# Patient Record
Sex: Male | Born: 1950
Health system: Southern US, Community
[De-identification: ages and names within clinical notes are randomized; demographics above are authoritative.]

## PROBLEM LIST (undated history)

## (undated) DIAGNOSIS — E079 Disorder of thyroid, unspecified: Secondary | ICD-10-CM

## (undated) DIAGNOSIS — C61 Malignant neoplasm of prostate: Secondary | ICD-10-CM

## (undated) DIAGNOSIS — I10 Essential (primary) hypertension: Secondary | ICD-10-CM

## (undated) DIAGNOSIS — K5792 Diverticulitis of intestine, part unspecified, without perforation or abscess without bleeding: Secondary | ICD-10-CM

## (undated) DIAGNOSIS — K219 Gastro-esophageal reflux disease without esophagitis: Secondary | ICD-10-CM

## (undated) DIAGNOSIS — E785 Hyperlipidemia, unspecified: Secondary | ICD-10-CM

## (undated) HISTORY — PX: HERNIA REPAIR: SHX51

## (undated) HISTORY — DX: Disorder of thyroid, unspecified: E07.9

## (undated) HISTORY — DX: Diverticulitis of intestine, part unspecified, without perforation or abscess without bleeding: K57.92

## (undated) HISTORY — DX: Gastro-esophageal reflux disease without esophagitis: K21.9

## (undated) HISTORY — PX: OTHER SURGICAL HISTORY: SHX169

## (undated) HISTORY — PX: UPPER GI ENDOSCOPY: SHX6162

## (undated) HISTORY — DX: Essential (primary) hypertension: I10

## (undated) HISTORY — PX: PROSTATE BIOPSY: SHX241

## (undated) HISTORY — DX: Hyperlipidemia, unspecified: E78.5

---

## 2003-04-11 DIAGNOSIS — K5792 Diverticulitis of intestine, part unspecified, without perforation or abscess without bleeding: Secondary | ICD-10-CM

## 2003-04-11 HISTORY — DX: Diverticulitis of intestine, part unspecified, without perforation or abscess without bleeding: K57.92

## 2009-12-29 LAB — HM COLONOSCOPY: HM Colonoscopy: NORMAL

## 2011-01-28 ENCOUNTER — Emergency Department (HOSPITAL_COMMUNITY)
Admission: EM | Admit: 2011-01-28 | Discharge: 2011-01-28 | Disposition: A | Discharge status: Home / Self Care | Payer: BC Managed Care – PPO | Attending: Emergency Medicine | Admitting: Emergency Medicine

## 2011-01-28 ENCOUNTER — Emergency Department (HOSPITAL_COMMUNITY): Payer: BC Managed Care – PPO

## 2011-01-28 DIAGNOSIS — Z79899 Other long term (current) drug therapy: Secondary | ICD-10-CM | POA: Insufficient documentation

## 2011-01-28 DIAGNOSIS — R0789 Other chest pain: Secondary | ICD-10-CM | POA: Insufficient documentation

## 2011-01-28 DIAGNOSIS — K219 Gastro-esophageal reflux disease without esophagitis: Secondary | ICD-10-CM | POA: Insufficient documentation

## 2011-01-28 DIAGNOSIS — I10 Essential (primary) hypertension: Secondary | ICD-10-CM | POA: Insufficient documentation

## 2011-01-28 LAB — POCT I-STAT, CHEM 8
Chloride: 106 mEq/L (ref 96–112)
Creatinine, Ser: 1 mg/dL (ref 0.50–1.35)
Glucose, Bld: 108 mg/dL — ABNORMAL HIGH (ref 70–99)
HCT: 41 % (ref 39.0–52.0)
Potassium: 3.6 mEq/L (ref 3.5–5.1)

## 2011-01-28 LAB — COMPREHENSIVE METABOLIC PANEL
ALT: 18 U/L (ref 0–53)
Alkaline Phosphatase: 47 U/L (ref 39–117)
CO2: 25 mEq/L (ref 19–32)
Calcium: 9.9 mg/dL (ref 8.4–10.5)
GFR calc Af Amer: >90 mL/min (ref >90)
GFR calc non Af Amer: >90 mL/min (ref >90)
Glucose, Bld: 108 mg/dL — ABNORMAL HIGH (ref 70–99)
Potassium: 3.5 mEq/L (ref 3.5–5.1)
Sodium: 136 mEq/L (ref 135–145)
Total Bilirubin: 0.5 mg/dL (ref 0.3–1.2)

## 2011-01-28 LAB — DIFFERENTIAL
Eosinophils Relative: 3 % (ref 0–5)
Lymphocytes Relative: 28 % (ref 12–46)
Lymphs Abs: 2 10*3/uL (ref 0.7–4.0)

## 2011-01-28 LAB — CBC
HCT: 39.6 % (ref 39.0–52.0)
MCV: 89.2 fL (ref 78.0–100.0)
RDW: 13 % (ref 11.5–15.5)
WBC: 7.3 10*3/uL (ref 4.0–10.5)

## 2011-01-30 ENCOUNTER — Encounter: Payer: Self-pay | Admitting: Internal Medicine

## 2011-01-30 ENCOUNTER — Other Ambulatory Visit (INDEPENDENT_AMBULATORY_CARE_PROVIDER_SITE_OTHER): Payer: BC Managed Care – PPO

## 2011-01-30 ENCOUNTER — Ambulatory Visit (INDEPENDENT_AMBULATORY_CARE_PROVIDER_SITE_OTHER): Payer: BC Managed Care – PPO | Admitting: Internal Medicine

## 2011-01-30 VITALS — BP 126/62 | HR 82 | Temp 97.8°F | Resp 16 | Wt 232.0 lb

## 2011-01-30 DIAGNOSIS — R109 Unspecified abdominal pain: Secondary | ICD-10-CM

## 2011-01-30 DIAGNOSIS — R0789 Other chest pain: Secondary | ICD-10-CM

## 2011-01-30 DIAGNOSIS — Z23 Encounter for immunization: Secondary | ICD-10-CM

## 2011-01-30 LAB — URINALYSIS, ROUTINE W REFLEX MICROSCOPIC
Bilirubin Urine: NEGATIVE
Hgb urine dipstick: NEGATIVE
Ketones, ur: NEGATIVE
Total Protein, Urine: NEGATIVE
Urine Glucose: NEGATIVE

## 2011-01-30 NOTE — Assessment & Plan Note (Addendum)
CXR shows no pathology, I will check a UA today to look for blood, etc. If there is blood or if the pain persists I may consider a CT scan of the area.

## 2011-01-30 NOTE — Assessment & Plan Note (Signed)
A repeat EKG today shows no acute pathology, I have asked him to do an ETT to look for ischemia, also he had a complete physical in May 2012 with Dr. Georgiana Shore so I have asked for those records to help give him some risk stratification for CAD

## 2011-01-30 NOTE — Progress Notes (Signed)
Subjective:    Patient ID: Juan Trevino, male    DOB: 1951/03/04, 60 y.o.   MRN: [id number]  HPI New to me he was seen in the ER over the last weekend with CP and testing done in the ER was negative for ischemia and he tells me that the CP has resolved. He also complains of sharp pain over his right lower rib cage and flank for nearly one year - it started when he "pulled a muscle" and has not resolved. His CXR done recently was negative for any pathology and his CBC/BMP were normal.   Review of Systems  Constitutional: Negative for fever, chills, diaphoresis, activity change, appetite change, fatigue and unexpected weight change.  HENT: Negative.   Eyes: Negative.   Respiratory: Negative for apnea, cough, choking, chest tightness, shortness of breath, wheezing and stridor.   Cardiovascular: Positive for chest pain. Negative for palpitations and leg swelling.  Gastrointestinal: Negative for nausea, vomiting, abdominal pain, diarrhea, constipation, blood in stool, abdominal distention, anal bleeding and rectal pain.  Genitourinary: Positive for flank pain (right). Negative for dysuria, urgency, frequency, hematuria, decreased urine volume, enuresis, difficulty urinating, genital sores and testicular pain.  Musculoskeletal: Negative for myalgias, back pain, joint swelling, arthralgias and gait problem.  Skin: Negative for color change, pallor, rash and wound.  Neurological: Negative for dizziness, tremors, seizures, syncope, facial asymmetry, speech difficulty, weakness, light-headedness, numbness and headaches.  Hematological: Negative for adenopathy. Does not bruise/bleed easily.  Psychiatric/Behavioral: Negative.        Objective:   Physical Exam  Vitals reviewed. Constitutional: He is oriented to person, place, and time. He appears well-developed and well-nourished. No distress.  HENT:  Head: Normocephalic and atraumatic.  Mouth/Throat: Oropharynx is clear and moist. No oropharyngeal  exudate.  Eyes: Conjunctivae are normal. Right eye exhibits no discharge. Left eye exhibits no discharge. No scleral icterus.  Neck: Normal range of motion. Neck supple. No JVD present. No tracheal deviation present. No thyromegaly present.  Cardiovascular: Normal rate, regular rhythm, normal heart sounds and intact distal pulses.  PMI is not displaced.  Exam reveals no gallop and no friction rub.   No murmur heard.  No systolic murmur is present   No diastolic murmur is present  Pulmonary/Chest: Effort normal and breath sounds normal. No stridor. No respiratory distress. He has no wheezes. He has no rales. Chest wall is not dull to percussion. He exhibits tenderness (he has ttp over the lowest aspect of his right rib cage). He exhibits no mass, no bony tenderness, no laceration, no crepitus, no edema, no deformity, no swelling and no retraction.  Abdominal: Soft. Normal appearance and bowel sounds are normal. He exhibits no shifting dullness, no distension, no pulsatile liver, no fluid wave, no abdominal bruit, no ascites, no pulsatile midline mass and no mass. There is no hepatosplenomegaly. There is no tenderness. There is no rigidity, no rebound, no guarding, no CVA tenderness, no tenderness at McBurney's point and negative Murphy's sign. No hernia. Hernia confirmed negative in the right inguinal area and confirmed negative in the left inguinal area.  Musculoskeletal: Normal range of motion. He exhibits no edema and no tenderness.  Lymphadenopathy:    He has no cervical adenopathy.  Neurological: He is oriented to person, place, and time. He displays normal reflexes. No cranial nerve deficit. He exhibits normal muscle tone. Coordination normal.  Skin: Skin is warm and dry. No rash noted. He is not diaphoretic. No erythema. No pallor.  Psychiatric: He has a normal mood  and affect. His behavior is normal. Judgment and thought content normal.      Lab Results  Component Value Date   WBC 7.3  01/28/2011   HGB 13.9 01/28/2011   HCT 41.0 01/28/2011   PLT 214 01/28/2011   GLUCOSE 108* 01/28/2011   ALT 18 01/28/2011   AST 17 01/28/2011   NA 139 01/28/2011   K 3.6 01/28/2011   CL 106 01/28/2011   CREATININE 1.00 01/28/2011   BUN 16 01/28/2011   CO2 25 01/28/2011      Assessment & Plan:

## 2011-01-30 NOTE — Patient Instructions (Signed)
Chest Pain (Nonspecific) It is often hard to give a specific diagnosis for the cause of chest pain. There is always a chance that your pain could be related to something serious, such as a heart attack or a blood clot in the lungs. You need to follow up with your caregiver for further evaluation. CAUSES   Heartburn.   Pneumonia or bronchitis.   Anxiety and stress.   Inflammation around your heart (pericarditis) or lung (pleuritis or pleurisy).   A blood clot in the lung.   A collapsed lung (pneumothorax). It can develop suddenly on its own (spontaneous pneumothorax) or from injury (trauma) to the chest.  The chest wall is composed of bones, muscles, and cartilage. Any of these can be the source of the pain.  The bones can be bruised by injury.   The muscles or cartilage can be strained by coughing or overwork.   The cartilage can be affected by inflammation and become sore (costochondritis).  DIAGNOSIS  Lab tests or other studies, such as X-rays, an EKG, stress testing, or cardiac imaging, may be needed to find the cause of your pain.  TREATMENT   Treatment depends on what may be causing your chest pain. Treatment may include:   Acid blockers for heartburn.   Anti-inflammatory medicine.   Pain medicine for inflammatory conditions.   Antibiotics if an infection is present.   You may be advised to change lifestyle habits. This includes stopping smoking and avoiding caffeine and chocolate.   You may be advised to keep your head raised (elevated) when sleeping. This reduces the chance of acid going backward from your stomach into your esophagus.   Most of the time, nonspecific chest pain will improve within 2 to 3 days with rest and mild pain medicine.  HOME CARE INSTRUCTIONS   If antibiotics were prescribed, take the full amount even if you start to feel better.   For the next few days, avoid physical activities that bring on chest pain. Continue physical activities as  directed.   Do not smoke cigarettes or drink alcohol until your symptoms are gone.   Only take over-the-counter or prescription medicine for pain, discomfort, or fever as directed by your caregiver.   Follow your caregiver's suggestions for further testing if your chest pain does not go away.   Keep any follow-up appointments you made. If you do not go to an appointment, you could develop lasting (chronic) problems with pain. If there is any problem keeping an appointment, you must call to reschedule.  SEEK MEDICAL CARE IF:   You think you are having problems from the medicine you are taking. Read your medicine instructions carefully.   Your chest pain does not go away, even after treatment.   You develop a rash with blisters on your chest.  SEEK IMMEDIATE MEDICAL CARE IF:   You have increased chest pain or pain that spreads to your arm, neck, jaw, back, or belly (abdomen).   You develop shortness of breath, an increasing cough, or you are coughing up blood.   You have severe back or abdominal pain, feel sick to your stomach (nauseous) or throw up (vomit).   You develop severe weakness, fainting, or chills.   You have an oral temperature above 102 F (38.9 C), not controlled by medicine.  THIS IS AN EMERGENCY. Do not wait to see if the pain will go away. Get medical help at once. Call your local emergency services (911 in [place] Do not drive yourself to   the hospital. MAKE SURE YOU:   Understand these instructions.   Will watch your condition.   Will get help right away if you are not doing well or get worse.  Document Released: 01/04/2005 Document Revised: 12/07/2010 Document Reviewed: 10/31/2007 Exit[employer] Patient Information 2012 [employer]. 

## 2011-02-08 ENCOUNTER — Telehealth: Payer: Self-pay

## 2011-02-08 NOTE — Telephone Encounter (Signed)
Patient called [facility] requesting results of recent labs. Please advise Thanks

## 2011-02-08 NOTE — Telephone Encounter (Signed)
Urine was normal

## 2011-02-09 NOTE — Telephone Encounter (Signed)
Pt also had labs on 01/28/11 that were scanned to Epic. Please advise.

## 2011-02-09 NOTE — Telephone Encounter (Signed)
Pt.notified

## 2011-02-09 NOTE — Telephone Encounter (Signed)
I have reviewed several sets of labs, the only thing I see abnormal is mildly elevated blood sugar

## 2011-02-09 NOTE — Telephone Encounter (Signed)
Left message on machine to return my call. 

## 2011-02-10 ENCOUNTER — Telehealth: Payer: Self-pay

## 2011-02-10 DIAGNOSIS — R109 Unspecified abdominal pain: Secondary | ICD-10-CM

## 2011-02-10 DIAGNOSIS — R10A1 Flank pain, right side: Secondary | ICD-10-CM

## 2011-02-10 NOTE — Telephone Encounter (Signed)
CT scan to see what is causing the flank pain

## 2011-02-10 NOTE — Telephone Encounter (Signed)
Patient called to check status of stress test appt. Appt set and pt notified of appt and labs. Per pt he continues to have have and would like to know what additional advisement MD has.

## 2011-02-15 ENCOUNTER — Encounter: Payer: Self-pay | Admitting: Internal Medicine

## 2011-02-15 LAB — CBC WITH DIFFERENTIAL/PLATELET
Granulocyte count absolute: 5.1
Granulocytes:: 65
Hemoglobin: 13.6 g/dL (ref 13.5–17.5)
LYMPH%: 26 %
RBC: 4.43
RDW: 12.9

## 2011-02-15 LAB — COMPLETE METABOLIC PANEL WITH GFR
Albumin: 4.3
BUN, Bld: 14
Calcium: 9.3 mg/dL
Chloride: 103 mmol/L
Glucose: 94
Potassium: 4.2 mmol/L
Total Protein ELP: 6.7

## 2011-02-15 LAB — CHOLESTEROL, TOTAL
TSH: 1.787
Triglycerides: 135

## 2011-02-16 ENCOUNTER — Ambulatory Visit (INDEPENDENT_AMBULATORY_CARE_PROVIDER_SITE_OTHER)
Admission: RE | Admit: 2011-02-16 | Discharge: 2011-02-16 | Disposition: A | Discharge status: Home / Self Care | Payer: BC Managed Care – PPO | Source: Ambulatory Visit | Attending: Internal Medicine | Admitting: Internal Medicine

## 2011-02-16 ENCOUNTER — Encounter: Payer: Self-pay | Admitting: Internal Medicine

## 2011-02-16 DIAGNOSIS — R109 Unspecified abdominal pain: Secondary | ICD-10-CM

## 2011-02-16 MED ORDER — IOHEXOL 300 MG/ML  SOLN
100.0000 mL | Freq: Once | INTRAMUSCULAR | Status: AC | PRN
  Administered 2011-02-16: 100 mL via INTRAVENOUS

## 2011-02-17 ENCOUNTER — Encounter: Payer: BC Managed Care – PPO | Admitting: *Deleted

## 2011-02-17 ENCOUNTER — Encounter: Payer: Self-pay | Admitting: *Deleted

## 2011-02-20 ENCOUNTER — Ambulatory Visit (INDEPENDENT_AMBULATORY_CARE_PROVIDER_SITE_OTHER): Payer: BC Managed Care – PPO | Admitting: Physician Assistant

## 2011-02-20 ENCOUNTER — Encounter: Payer: BC Managed Care – PPO | Admitting: Physician Assistant

## 2011-02-20 DIAGNOSIS — R0789 Other chest pain: Secondary | ICD-10-CM

## 2011-02-20 NOTE — Progress Notes (Deleted)
Adult Stress Test Report  02/20/2011   Requesting Physician: Sanda Linger, MD, MD  Study: {noninvasive [phone]  Pre-test ECG: {normal/abnormal:14647}  Level of Stress:  ***% age-predicted max HR  *** METS achieved  Functional Capacity: {funct capacity:14698}  Abnormal Symptoms: {symptoms:14699}  Heart Rate Response: {hr response:14700}  BP Response:  {bp response:14701}  Baseline LVEF: Echo *** %,  Nuclear *** %  Stress ECG: {findings; ecg:14702}  Stress Imaging Report:  {findings; stress imaging:14703}   Impression:   {findings; stress test:14704}  Interpreted by:  Marlowe Kays 02/20/2011

## 2011-02-20 NOTE — Progress Notes (Signed)
Exercise Treadmill Test  Pre-Exercise Testing Evaluation Rhythm: normal sinus  Rate: 68   PR:  .16 QRS:  .10  QT:  .39 QTc: .41     Test  Exercise Tolerance Test Ordering MD: Sanda Linger M.D  Interpreting MD:  Tereso Newcomer PA-C  Unique Test No: 1  Treadmill:  1  Indication for ETT: chest pain - rule out ischemia  Contraindication to ETT: No   Stress Modality: exercise - treadmill  Cardiac Imaging Performed: non   Protocol: standard Bruce - maximal  Max BP:  212/75  Max MPHR (bpm):  159 85% MPR (bpm):  135  MPHR obtained (bpm):  153 % MPHR obtained:  95%  Reached 85% MPHR (min:sec):  4:58 Total Exercise Time (min-sec):  7:32  Workload in METS:  9.5 Borg Scale: 13  Reason ETT Terminated:  desired heart rate attained    ST Segment Analysis At Rest: normal ST segments - no evidence of significant ST depression With Exercise: non-specific ST changes  Other Information Arrhythmia:  No Angina during ETT:  absent (0) Quality of ETT:  diagnostic  ETT Interpretation:  normal - no evidence of ischemia by ST analysis  Comments: Good exercise tolerance. No chest pain. Normal BP response to exercise. No ST-T changes to suggest ischemia.   Recommendations: Follow up with Dr. Yetta Barre as directed.

## 2011-11-06 ENCOUNTER — Ambulatory Visit (INDEPENDENT_AMBULATORY_CARE_PROVIDER_SITE_OTHER)
Admission: RE | Admit: 2011-11-06 | Discharge: 2011-11-06 | Disposition: A | Discharge status: Home / Self Care | Payer: BC Managed Care – PPO | Source: Ambulatory Visit | Attending: Internal Medicine | Admitting: Internal Medicine

## 2011-11-06 ENCOUNTER — Other Ambulatory Visit (INDEPENDENT_AMBULATORY_CARE_PROVIDER_SITE_OTHER): Payer: BC Managed Care – PPO

## 2011-11-06 ENCOUNTER — Encounter: Payer: Self-pay | Admitting: Internal Medicine

## 2011-11-06 ENCOUNTER — Ambulatory Visit (INDEPENDENT_AMBULATORY_CARE_PROVIDER_SITE_OTHER): Payer: BC Managed Care – PPO | Admitting: Internal Medicine

## 2011-11-06 ENCOUNTER — Telehealth: Payer: Self-pay | Admitting: Internal Medicine

## 2011-11-06 VITALS — BP 118/66 | HR 70 | Temp 98.2°F | Resp 16 | Ht 72.0 in | Wt 233.0 lb

## 2011-11-06 DIAGNOSIS — L989 Disorder of the skin and subcutaneous tissue, unspecified: Secondary | ICD-10-CM | POA: Insufficient documentation

## 2011-11-06 DIAGNOSIS — M25551 Pain in right hip: Secondary | ICD-10-CM | POA: Insufficient documentation

## 2011-11-06 DIAGNOSIS — K219 Gastro-esophageal reflux disease without esophagitis: Secondary | ICD-10-CM

## 2011-11-06 DIAGNOSIS — R7309 Other abnormal glucose: Secondary | ICD-10-CM

## 2011-11-06 DIAGNOSIS — M545 Low back pain, unspecified: Secondary | ICD-10-CM

## 2011-11-06 DIAGNOSIS — I10 Essential (primary) hypertension: Secondary | ICD-10-CM

## 2011-11-06 DIAGNOSIS — Z0000 Encounter for general adult medical examination without abnormal findings: Secondary | ICD-10-CM | POA: Insufficient documentation

## 2011-11-06 DIAGNOSIS — M25559 Pain in unspecified hip: Secondary | ICD-10-CM

## 2011-11-06 LAB — COMPREHENSIVE METABOLIC PANEL
ALT: 18 U/L (ref 0–53)
Albumin: 4 g/dL (ref 3.5–5.2)
CO2: 28 mEq/L (ref 19–32)
Calcium: 9.7 mg/dL (ref 8.4–10.5)
Chloride: 102 mEq/L (ref 96–112)
GFR: 77.68 mL/min (ref >60.00)
Glucose, Bld: 93 mg/dL (ref 70–99)
Potassium: 4 mEq/L (ref 3.5–5.1)
Sodium: 137 mEq/L (ref 135–145)
Total Protein: 7 g/dL (ref 6.0–8.3)

## 2011-11-06 LAB — CBC WITH DIFFERENTIAL/PLATELET
Basophils Absolute: 0 10*3/uL (ref 0.0–0.1)
Eosinophils Relative: 3.8 % (ref 0.0–5.0)
HCT: 41.5 % (ref 39.0–52.0)
Lymphocytes Relative: 29 % (ref 12.0–46.0)
Lymphs Abs: 2.2 10*3/uL (ref 0.7–4.0)
Monocytes Relative: 7.3 % (ref 3.0–12.0)
Neutrophils Relative %: 59.3 % (ref 43.0–77.0)
Platelets: 204 10*3/uL (ref 150.0–400.0)
RDW: 13.1 % (ref 11.5–14.6)
WBC: 7.5 10*3/uL (ref 4.5–10.5)

## 2011-11-06 LAB — URINALYSIS, ROUTINE W REFLEX MICROSCOPIC
Hgb urine dipstick: NEGATIVE
Nitrite: NEGATIVE
Specific Gravity, Urine: >=1.03 (ref 1.000–1.030)
Total Protein, Urine: NEGATIVE
Urine Glucose: NEGATIVE

## 2011-11-06 LAB — TSH: TSH: 1.41 u[IU]/mL (ref 0.35–5.50)

## 2011-11-06 LAB — LIPID PANEL: HDL: 46.1 mg/dL (ref >39.00)

## 2011-11-06 LAB — HEMOGLOBIN A1C: Hgb A1c MFr Bld: 5.6 % (ref 4.6–6.5)

## 2011-11-06 LAB — PSA: PSA: 2.04 ng/mL (ref 0.10–4.00)

## 2011-11-06 LAB — FECAL OCCULT BLOOD, GUAIAC: Fecal Occult Blood: NEGATIVE

## 2011-11-06 MED ORDER — PANTOPRAZOLE SODIUM 40 MG PO TBEC: 40.0000 mg | DELAYED_RELEASE_TABLET | Freq: Every day | ORAL | Status: DC

## 2011-11-06 MED ORDER — LISINOPRIL 20 MG PO TABS: 20.0000 mg | ORAL_TABLET | Freq: Every day | ORAL | Status: DC

## 2011-11-06 NOTE — Patient Instructions (Signed)
Health Maintenance, Males A healthy lifestyle and preventative care can promote health and wellness.  Maintain regular health, dental, and eye exams.   Eat a healthy diet. Foods like vegetables, fruits, whole grains, low-fat dairy products, and lean protein foods contain the nutrients you need without too many calories. Decrease your intake of foods high in solid fats, added sugars, and salt. Get information about a proper diet from your caregiver, if necessary.   Regular physical exercise is one of the most important things you can do for your health. Most adults should get at least 150 minutes of moderate-intensity exercise (any activity that increases your heart rate and causes you to sweat) each week. In addition, most adults need muscle-strengthening exercises on 2 or more days a week.    Maintain a healthy weight. The body mass index (BMI) is a screening tool to identify possible weight problems. It provides an estimate of body fat based on height and weight. Your caregiver can help determine your BMI, and can help you achieve or maintain a healthy weight. For adults 20 years and older:   A BMI below 18.5 is considered underweight.   A BMI of 18.5 to 24.9 is normal.   A BMI of 25 to 29.9 is considered overweight.   A BMI of 30 and above is considered obese.   Maintain normal blood lipids and cholesterol by exercising and minimizing your intake of saturated fat. Eat a balanced diet with plenty of fruits and vegetables. Blood tests for lipids and cholesterol should begin at age 20 and be repeated every 5 years. If your lipid or cholesterol levels are high, you are over 50, or you are a high risk for heart disease, you may need your cholesterol levels checked more frequently.Ongoing high lipid and cholesterol levels should be treated with medicines, if diet and exercise are not effective.   If you smoke, find out from your caregiver how to quit. If you do not use tobacco, do not start.    If you choose to drink alcohol, do not exceed 2 drinks per day. One drink is considered to be 12 ounces (355 mL) of beer, 5 ounces (148 mL) of wine, or 1.5 ounces (44 mL) of liquor.   Avoid use of street drugs. Do not share needles with anyone. Ask for help if you need support or instructions about stopping the use of drugs.   High blood pressure causes heart disease and increases the risk of stroke. Blood pressure should be checked at least every 1 to 2 years. Ongoing high blood pressure should be treated with medicines if weight loss and exercise are not effective.   If you are 45 to 62 years old, ask your caregiver if you should take aspirin to prevent heart disease.   Diabetes screening involves taking a blood sample to check your fasting blood sugar level. This should be done once every 3 years, after age 45, if you are within normal weight and without risk factors for diabetes. Testing should be considered at a younger age or be carried out more frequently if you are overweight and have at least 1 risk factor for diabetes.   Colorectal cancer can be detected and often prevented. Most routine colorectal cancer screening begins at the age of 50 and continues through age 75. However, your caregiver may recommend screening at an earlier age if you have risk factors for colon cancer. On a yearly basis, your caregiver may provide home test kits to check for hidden   blood in the stool. Use of a small camera at the end of a tube, to directly examine the colon (sigmoidoscopy or colonoscopy), can detect the earliest forms of colorectal cancer. Talk to your caregiver about this at age 50, when routine screening begins. Direct examination of the colon should be repeated every 5 to 10 years through age 75, unless early forms of pre-cancerous polyps or small growths are found.   Hepatitis C blood testing is recommended for all people born from 1945 through 1965 and any individual with known risks for  hepatitis C.   Healthy men should no longer receive prostate-specific antigen (PSA) blood tests as part of routine cancer screening. Consult with your caregiver about prostate cancer screening.   Testicular cancer screening is not recommended for adolescents or adult males who have no symptoms. Screening includes self-exam, caregiver exam, and other screening tests. Consult with your caregiver about any symptoms you have or any concerns you have about testicular cancer.   Practice safe sex. Use condoms and avoid high-risk sexual practices to reduce the spread of sexually transmitted infections (STIs).   Use sunscreen with a sun protection factor (SPF) of 30 or greater. Apply sunscreen liberally and repeatedly throughout the day. You should seek shade when your shadow is shorter than you. Protect yourself by wearing long sleeves, pants, a wide-brimmed hat, and sunglasses year round, whenever you are outdoors.   Notify your caregiver of new moles or changes in moles, especially if there is a change in shape or color. Also notify your caregiver if a mole is larger than the size of a pencil eraser.   A one-time screening for abdominal aortic aneurysm (AAA) and surgical repair of large AAAs by sound wave imaging (ultrasonography) is recommended for ages 65 to 75 years who are current or former smokers.   Stay current with your immunizations.  Document Released: 09/23/2007 Document Revised: 03/16/2011 Document Reviewed: 08/22/2010 [employer] Patient Information 2012 [employer]. 

## 2011-11-06 NOTE — Assessment & Plan Note (Signed)
I will check a plain film today 

## 2011-11-06 NOTE — Assessment & Plan Note (Signed)
He does not want any meds for pain, I will check a plain film today

## 2011-11-06 NOTE — Telephone Encounter (Signed)
Pt req Lisinopril 20 mg to be send to [facility] on [address]. Pt stated he was just in for an appt and forgot to ask refill for this med. Please call pt once its done.

## 2011-11-06 NOTE — Assessment & Plan Note (Signed)
His BP is well controlled, I will check his lytes and renal function 

## 2011-11-06 NOTE — Assessment & Plan Note (Signed)
I will check an a1c to see if he has DM II

## 2011-11-06 NOTE — Progress Notes (Signed)
Subjective:    Patient ID: Juan Trevino, male    DOB: 04/15/1950, 61 y.o.   MRN: [id number]  Arthritis Presents for initial visit. The disease course has been worsening. The condition has lasted for 4 months. He complains of pain. He reports no stiffness, joint swelling or joint warmth. Affected locations include the right hip. His pain is at a severity of 2/10. Pertinent negatives include no diarrhea, dry eyes, dry mouth, dysuria, fatigue, fever, pain at night, pain while resting, rash, Raynaud's syndrome, uveitis or weight loss. His past medical history is significant for chronic back pain and osteoarthritis. Past treatments include nothing.      Review of Systems  Constitutional: Negative for fever, chills, weight loss, diaphoresis, appetite change, fatigue and unexpected weight change.  HENT: Negative.   Eyes: Negative.   Respiratory: Negative for apnea, cough, choking, chest tightness, shortness of breath, wheezing and stridor.   Cardiovascular: Negative for chest pain, palpitations and leg swelling.  Gastrointestinal: Negative for nausea, vomiting, abdominal pain, diarrhea, constipation, blood in stool, abdominal distention, anal bleeding and rectal pain.  Genitourinary: Negative.  Negative for dysuria and difficulty urinating.  Musculoskeletal: Positive for back pain (unchanged for one year), arthralgias (right hip for several months) and arthritis. Negative for myalgias, joint swelling, gait problem and stiffness.  Skin: Negative for color change, pallor, rash and wound.  Neurological: Negative.   Hematological: Negative for adenopathy. Does not bruise/bleed easily.  Psychiatric/Behavioral: Negative.        Objective:   Physical Exam  Vitals reviewed. Constitutional: He is oriented to person, place, and time. He appears well-developed and well-nourished. No distress.  HENT:  Head: Normocephalic and atraumatic.    Mouth/Throat: Oropharynx is clear and moist. No oropharyngeal  exudate.  Eyes: Conjunctivae and EOM are normal. Pupils are equal, round, and reactive to light. Right eye exhibits no discharge. Left eye exhibits no discharge. No scleral icterus.  Neck: Normal range of motion. Neck supple. No JVD present. No tracheal deviation present. No thyromegaly present.  Cardiovascular: Normal rate, regular rhythm, normal heart sounds and intact distal pulses.  Exam reveals no gallop and no friction rub.   No murmur heard. Pulmonary/Chest: Effort normal and breath sounds normal. No stridor. No respiratory distress. He has no wheezes. He has no rales. He exhibits no tenderness.  Abdominal: Soft. Bowel sounds are normal. He exhibits no distension and no mass. There is no tenderness. There is no rebound and no guarding. Hernia confirmed negative in the right inguinal area and confirmed negative in the left inguinal area.  Genitourinary: Rectum normal, prostate normal, testes normal and penis normal. Rectal exam shows no external hemorrhoid, no internal hemorrhoid, no fissure, no mass, no tenderness and anal tone normal. Guaiac negative stool. Prostate is not enlarged and not tender. Right testis shows no mass, no swelling and no tenderness. Right testis is descended. Left testis shows no mass, no swelling and no tenderness. Left testis is descended. Circumcised. No penile tenderness. No discharge found.  Musculoskeletal: Normal range of motion. He exhibits no edema and no tenderness.       Right hip: Normal. He exhibits normal range of motion, normal strength, no tenderness, no bony tenderness, no swelling, no crepitus and no deformity.       Lumbar back: Normal. He exhibits normal range of motion, no tenderness, no bony tenderness, no swelling, no edema, no deformity, no laceration, no pain and no spasm.  Lymphadenopathy:    He has no cervical adenopathy.  Right: No inguinal adenopathy present.       Left: No inguinal adenopathy present.  Neurological: He is alert and  oriented to person, place, and time. He has normal strength and normal reflexes. He displays no atrophy, no tremor and normal reflexes. No cranial nerve deficit or sensory deficit. He exhibits normal muscle tone. He displays a negative Romberg sign. He displays no seizure activity. Coordination and gait normal.  Skin: Skin is warm and dry. No rash noted. He is not diaphoretic. No erythema. No pallor.  Psychiatric: He has a normal mood and affect. His behavior is normal. Judgment and thought content normal.      Lab Results  Component Value Date   WBC 7.3 01/28/2011   HGB 13.9 01/28/2011   HCT 41.0 01/28/2011   PLT 214 01/28/2011   GLUCOSE 108* 01/28/2011   TRIG 135 08/19/2010   HDL 43 08/19/2010   LDLCALC 131 08/19/2010   ALT 18 01/28/2011   AST 17 01/28/2011   NA 139 01/28/2011   K 3.6 01/28/2011   CL 106 01/28/2011   CREATININE 1.00 01/28/2011   BUN 16 01/28/2011   CO2 25 01/28/2011   TSH 1.787 08/19/2010   PSA 1.77 08/19/2010      Assessment & Plan:

## 2011-11-06 NOTE — Assessment & Plan Note (Signed)
This looks like BCC so I referred him to dermatology

## 2011-11-06 NOTE — Assessment & Plan Note (Signed)
Exam done, vaccines were updated, labs ordered, pt ed material was given 

## 2012-11-06 ENCOUNTER — Encounter: Payer: Self-pay | Admitting: Internal Medicine

## 2012-11-06 ENCOUNTER — Other Ambulatory Visit (INDEPENDENT_AMBULATORY_CARE_PROVIDER_SITE_OTHER): Payer: BC Managed Care – PPO

## 2012-11-06 ENCOUNTER — Ambulatory Visit (INDEPENDENT_AMBULATORY_CARE_PROVIDER_SITE_OTHER): Payer: BC Managed Care – PPO | Admitting: Internal Medicine

## 2012-11-06 VITALS — BP 138/68 | HR 88 | Temp 98.7°F | Resp 16 | Ht 72.0 in | Wt 236.0 lb

## 2012-11-06 DIAGNOSIS — I10 Essential (primary) hypertension: Secondary | ICD-10-CM

## 2012-11-06 DIAGNOSIS — Z23 Encounter for immunization: Secondary | ICD-10-CM

## 2012-11-06 DIAGNOSIS — E669 Obesity, unspecified: Secondary | ICD-10-CM

## 2012-11-06 DIAGNOSIS — E66811 Obesity, class 1: Secondary | ICD-10-CM

## 2012-11-06 DIAGNOSIS — Z0000 Encounter for general adult medical examination without abnormal findings: Secondary | ICD-10-CM

## 2012-11-06 DIAGNOSIS — Z2911 Encounter for prophylactic immunotherapy for respiratory syncytial virus (RSV): Secondary | ICD-10-CM

## 2012-11-06 LAB — CBC WITH DIFFERENTIAL/PLATELET
Eosinophils Relative: 2.5 % (ref 0.0–5.0)
Lymphocytes Relative: 22.4 % (ref 12.0–46.0)
Lymphs Abs: 1.9 10*3/uL (ref 0.7–4.0)
Monocytes Relative: 6.5 % (ref 3.0–12.0)
Neutro Abs: 5.7 10*3/uL (ref 1.4–7.7)

## 2012-11-06 LAB — URINALYSIS, ROUTINE W REFLEX MICROSCOPIC
Hgb urine dipstick: NEGATIVE
Ketones, ur: NEGATIVE
Leukocytes, UA: NEGATIVE
RBC / HPF: NONE SEEN (ref 0–?)
Specific Gravity, Urine: 1.025 (ref 1.000–1.030)
Urine Glucose: NEGATIVE
Urobilinogen, UA: 1 (ref 0.0–1.0)

## 2012-11-06 LAB — LIPID PANEL
HDL: 42.5 mg/dL (ref >39.00)
VLDL: 60.2 mg/dL — ABNORMAL HIGH (ref 0.0–40.0)

## 2012-11-06 LAB — COMPREHENSIVE METABOLIC PANEL
AST: 18 U/L (ref 0–37)
Albumin: 3.9 g/dL (ref 3.5–5.2)
Alkaline Phosphatase: 44 U/L (ref 39–117)
Calcium: 9.8 mg/dL (ref 8.4–10.5)
Chloride: 103 mEq/L (ref 96–112)
Potassium: 4.4 mEq/L (ref 3.5–5.1)
Sodium: 139 mEq/L (ref 135–145)
Total Protein: 6.9 g/dL (ref 6.0–8.3)

## 2012-11-06 LAB — TSH: TSH: 1.48 u[IU]/mL (ref 0.35–5.50)

## 2012-11-06 LAB — PSA: PSA: 2.09 ng/mL (ref 0.10–4.00)

## 2012-11-06 NOTE — Progress Notes (Signed)
Subjective:    Patient ID: Juan Trevino, male    DOB: 1950-05-02, 62 y.o.   MRN: [id number]  Hypertension This is a chronic problem. The current episode started more than 1 year ago. The problem is unchanged. The problem is controlled. Pertinent negatives include no anxiety, blurred vision, chest pain, headaches, malaise/fatigue, neck pain, orthopnea, palpitations, peripheral edema, PND, shortness of breath or sweats. Past treatments include ACE inhibitors and diuretics. The current treatment provides moderate improvement. Compliance problems include exercise and diet.       Review of Systems  Constitutional: Negative.  Negative for fever, chills, malaise/fatigue, diaphoresis, appetite change, fatigue and unexpected weight change.  HENT: Negative.  Negative for neck pain.   Eyes: Negative.  Negative for blurred vision.  Respiratory: Negative.  Negative for cough, choking, chest tightness, shortness of breath, wheezing and stridor.   Cardiovascular: Negative.  Negative for chest pain, palpitations, orthopnea, leg swelling and PND.  Gastrointestinal: Negative.  Negative for nausea, vomiting, abdominal pain, diarrhea and constipation.  Endocrine: Negative.   Genitourinary: Negative.  Negative for penile swelling, scrotal swelling, difficulty urinating and testicular pain.  Musculoskeletal: Negative.  Negative for myalgias, back pain and arthralgias.  Skin: Negative.   Allergic/Immunologic: Negative.   Neurological: Negative.  Negative for dizziness and headaches.  Hematological: Negative.  Negative for adenopathy. Does not bruise/bleed easily.  Psychiatric/Behavioral: Negative.        Objective:   Physical Exam  Vitals reviewed. Constitutional: He is oriented to person, place, and time. He appears well-developed and well-nourished. No distress.  HENT:  Head: Normocephalic and atraumatic.  Mouth/Throat: Oropharynx is clear and moist. No oropharyngeal exudate.  Eyes: Conjunctivae are  normal. Right eye exhibits no discharge. Left eye exhibits no discharge. No scleral icterus.  Neck: Normal range of motion. Neck supple. No JVD present. No tracheal deviation present. No thyromegaly present.  Cardiovascular: Normal rate, regular rhythm, normal heart sounds and intact distal pulses.  Exam reveals no gallop and no friction rub.   No murmur heard. Pulmonary/Chest: Effort normal and breath sounds normal. No stridor. No respiratory distress. He has no wheezes. He has no rales. He exhibits no tenderness.  Abdominal: Soft. Bowel sounds are normal. He exhibits no distension and no mass. There is no tenderness. There is no rebound and no guarding. Hernia confirmed negative in the right inguinal area and confirmed negative in the left inguinal area.  Genitourinary: Rectum normal, prostate normal, testes normal and penis normal. Rectal exam shows no external hemorrhoid, no internal hemorrhoid, no fissure, no mass, no tenderness and anal tone normal. Guaiac negative stool. Prostate is not enlarged and not tender. Right testis shows no mass, no swelling and no tenderness. Right testis is descended. Left testis shows no mass, no swelling and no tenderness. Left testis is descended. Circumcised. No penile erythema or penile tenderness. No discharge found.  Musculoskeletal: Normal range of motion. He exhibits no edema and no tenderness.  Lymphadenopathy:    He has no cervical adenopathy.       Right: No inguinal adenopathy present.       Left: No inguinal adenopathy present.  Neurological: He is oriented to person, place, and time.  Skin: Skin is warm and dry. No rash noted. He is not diaphoretic. No erythema. No pallor.  Psychiatric: He has a normal mood and affect. His behavior is normal. Judgment and thought content normal.      Lab Results  Component Value Date   WBC 7.5 11/06/2011   HGB 14.0  11/06/2011   HCT 41.5 11/06/2011   PLT 204.0 11/06/2011   GLUCOSE 93 11/06/2011   CHOL 218*  11/06/2011   TRIG 122.0 11/06/2011   HDL 46.10 11/06/2011   LDLDIRECT 144.6 11/06/2011   LDLCALC 131 08/19/2010   ALT 18 11/06/2011   AST 17 11/06/2011   NA 137 11/06/2011   K 4.0 11/06/2011   CL 102 11/06/2011   CREATININE 1.0 11/06/2011   BUN 14 11/06/2011   CO2 28 11/06/2011   TSH 1.41 11/06/2011   PSA 2.04 11/06/2011   HGBA1C 5.6 11/06/2011      Assessment & Plan:

## 2012-11-06 NOTE — Assessment & Plan Note (Addendum)
Exam done Vaccines were reviewed and updated Labs ordered Pt ed material was given 

## 2012-11-06 NOTE — Assessment & Plan Note (Signed)
He will work on his lifestyle modifications to lose weight 

## 2012-11-06 NOTE — Assessment & Plan Note (Signed)
His BP is well controlled Today I will check his lytes and renal function 

## 2012-11-06 NOTE — Patient Instructions (Addendum)
Health Maintenance, Males A healthy lifestyle and preventative care can promote health and wellness.  Maintain regular health, dental, and eye exams.  Eat a healthy diet. Foods like vegetables, fruits, whole grains, low-fat dairy products, and lean protein foods contain the nutrients you need without too many calories. Decrease your intake of foods high in solid fats, added sugars, and salt. Get information about a proper diet from your caregiver, if necessary.  Regular physical exercise is one of the most important things you can do for your health. Most adults should get at least 150 minutes of moderate-intensity exercise (any activity that increases your heart rate and causes you to sweat) each week. In addition, most adults need muscle-strengthening exercises on 2 or more days a week.   Maintain a healthy weight. The body mass index (BMI) is a screening tool to identify possible weight problems. It provides an estimate of body fat based on height and weight. Your caregiver can help determine your BMI, and can help you achieve or maintain a healthy weight. For adults 20 years and older:  A BMI below 18.5 is considered underweight.  A BMI of 18.5 to 24.9 is normal.  A BMI of 25 to 29.9 is considered overweight.  A BMI of 30 and above is considered obese.  Maintain normal blood lipids and cholesterol by exercising and minimizing your intake of saturated fat. Eat a balanced diet with plenty of fruits and vegetables. Blood tests for lipids and cholesterol should begin at age 20 and be repeated every 5 years. If your lipid or cholesterol levels are high, you are over 50, or you are a high risk for heart disease, you may need your cholesterol levels checked more frequently.Ongoing high lipid and cholesterol levels should be treated with medicines, if diet and exercise are not effective.  If you smoke, find out from your caregiver how to quit. If you do not use tobacco, do not start.  If you  choose to drink alcohol, do not exceed 2 drinks per day. One drink is considered to be 12 ounces (355 mL) of beer, 5 ounces (148 mL) of wine, or 1.5 ounces (44 mL) of liquor.  Avoid use of street drugs. Do not share needles with anyone. Ask for help if you need support or instructions about stopping the use of drugs.  High blood pressure causes heart disease and increases the risk of stroke. Blood pressure should be checked at least every 1 to 2 years. Ongoing high blood pressure should be treated with medicines if weight loss and exercise are not effective.  If you are 45 to 63 years old, ask your caregiver if you should take aspirin to prevent heart disease.  Diabetes screening involves taking a blood sample to check your fasting blood sugar level. This should be done once every 3 years, after age 45, if you are within normal weight and without risk factors for diabetes. Testing should be considered at a younger age or be carried out more frequently if you are overweight and have at least 1 risk factor for diabetes.  Colorectal cancer can be detected and often prevented. Most routine colorectal cancer screening begins at the age of 50 and continues through age 75. However, your caregiver may recommend screening at an earlier age if you have risk factors for colon cancer. On a yearly basis, your caregiver may provide home test kits to check for hidden blood in the stool. Use of a small camera at the end of a tube,   to directly examine the colon (sigmoidoscopy or colonoscopy), can detect the earliest forms of colorectal cancer. Talk to your caregiver about this at age 50, when routine screening begins. Direct examination of the colon should be repeated every 5 to 10 years through age 75, unless early forms of pre-cancerous polyps or small growths are found.  Hepatitis C blood testing is recommended for all people born from 1945 through 1965 and any individual with known risks for hepatitis C.  Healthy  men should no longer receive prostate-specific antigen (PSA) blood tests as part of routine cancer screening. Consult with your caregiver about prostate cancer screening.  Testicular cancer screening is not recommended for adolescents or adult males who have no symptoms. Screening includes self-exam, caregiver exam, and other screening tests. Consult with your caregiver about any symptoms you have or any concerns you have about testicular cancer.  Practice safe sex. Use condoms and avoid high-risk sexual practices to reduce the spread of sexually transmitted infections (STIs).  Use sunscreen with a sun protection factor (SPF) of 30 or greater. Apply sunscreen liberally and repeatedly throughout the day. You should seek shade when your shadow is shorter than you. Protect yourself by wearing long sleeves, pants, a wide-brimmed hat, and sunglasses year round, whenever you are outdoors.  Notify your caregiver of new moles or changes in moles, especially if there is a change in shape or color. Also notify your caregiver if a mole is larger than the size of a pencil eraser.  A one-time screening for abdominal aortic aneurysm (AAA) and surgical repair of large AAAs by sound wave imaging (ultrasonography) is recommended for ages 65 to 75 years who are current or former smokers.  Stay current with your immunizations. Document Released: 09/23/2007 Document Revised: 06/19/2011 Document Reviewed: 08/22/2010 [employer] Patient Information 2014 [employer].  

## 2012-11-07 ENCOUNTER — Encounter: Payer: Self-pay | Admitting: Internal Medicine

## 2012-11-07 LAB — HEPATITIS C ANTIBODY: HCV Ab: NEGATIVE

## 2012-11-07 NOTE — Progress Notes (Signed)
  Subjective:    Patient ID: Juan Trevino, male    DOB: 06/26/1950, 62 y.o.   MRN: [id number]  HPI    Review of Systems     Objective:   Physical Exam        Assessment & Plan:

## 2013-01-16 ENCOUNTER — Other Ambulatory Visit: Payer: Self-pay | Admitting: Internal Medicine

## 2013-01-17 ENCOUNTER — Other Ambulatory Visit: Payer: Self-pay | Admitting: Internal Medicine

## 2013-08-29 IMAGING — CR DG HIP COMPLETE 2+V*R*
3 series · 3 of 3 positions shown · non-contrast
Comparison: None

CLINICAL DATA: Pain

RIGHT HIP - COMPLETE 2+ VIEW

[view not recorded (1 of 3)]
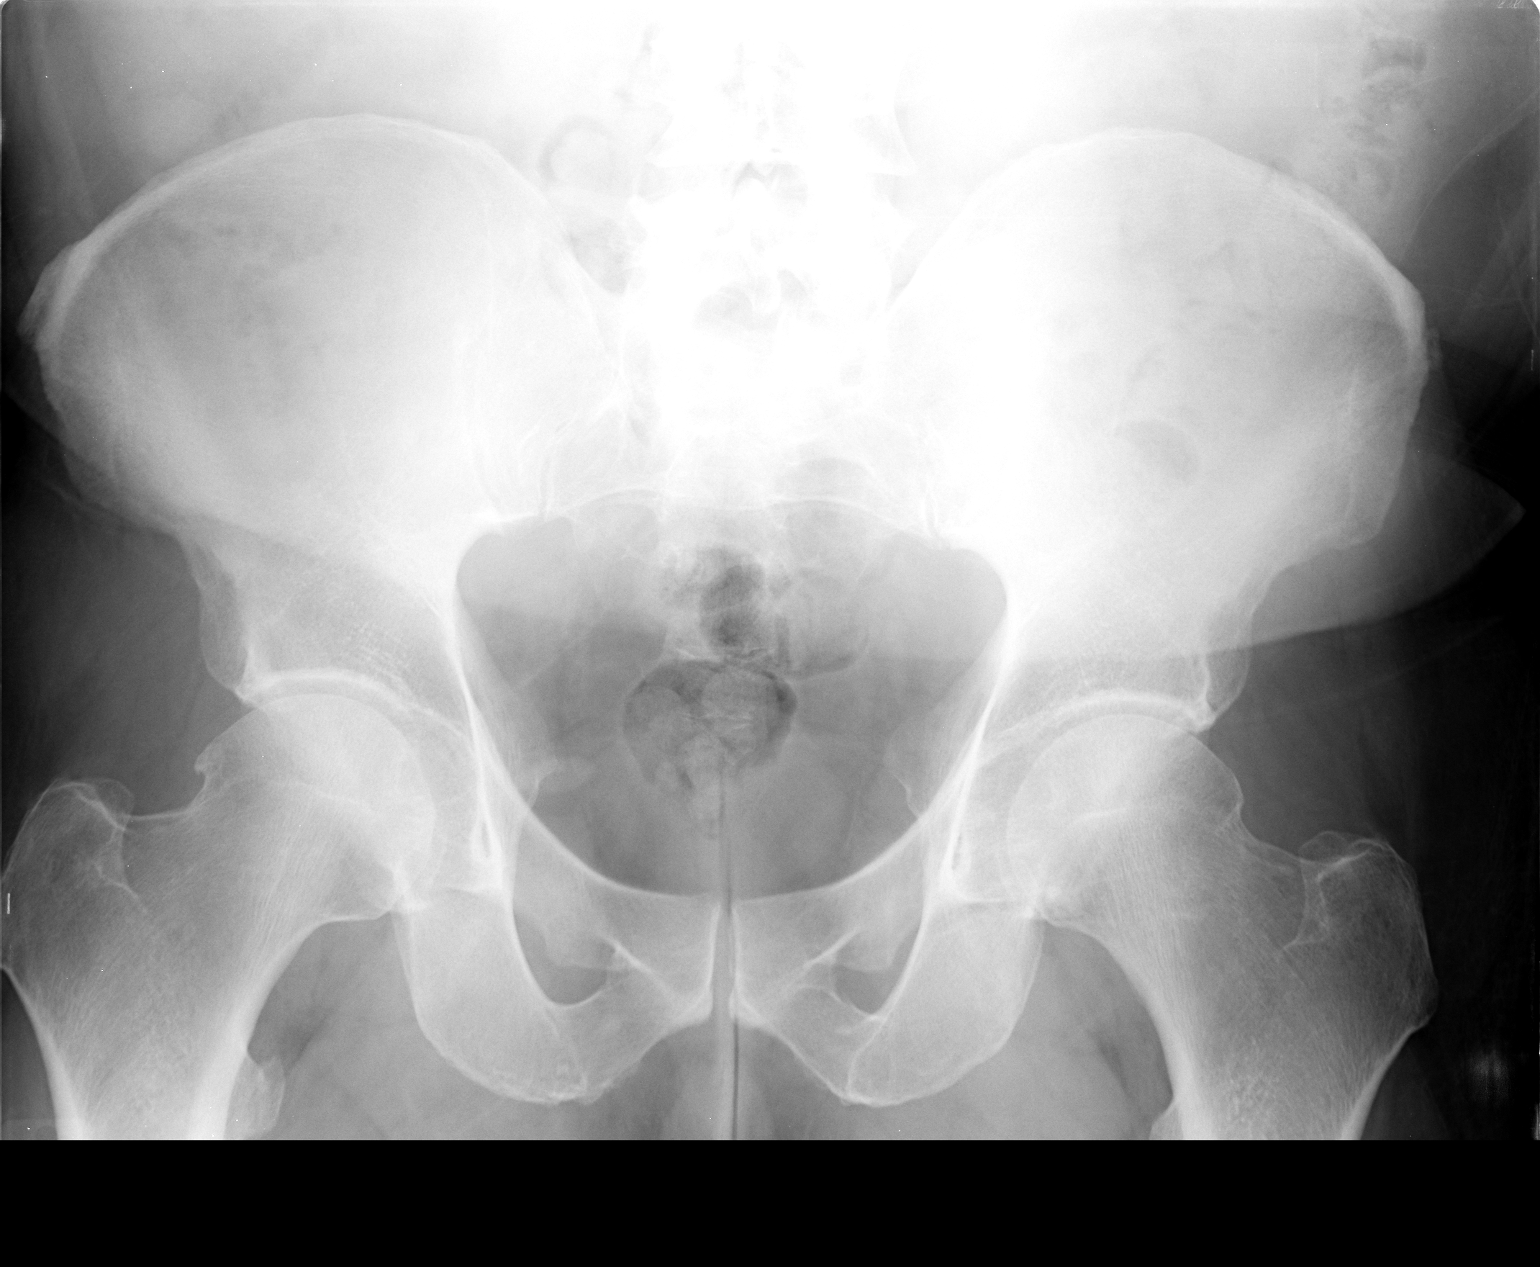

[view not recorded (2 of 3)]
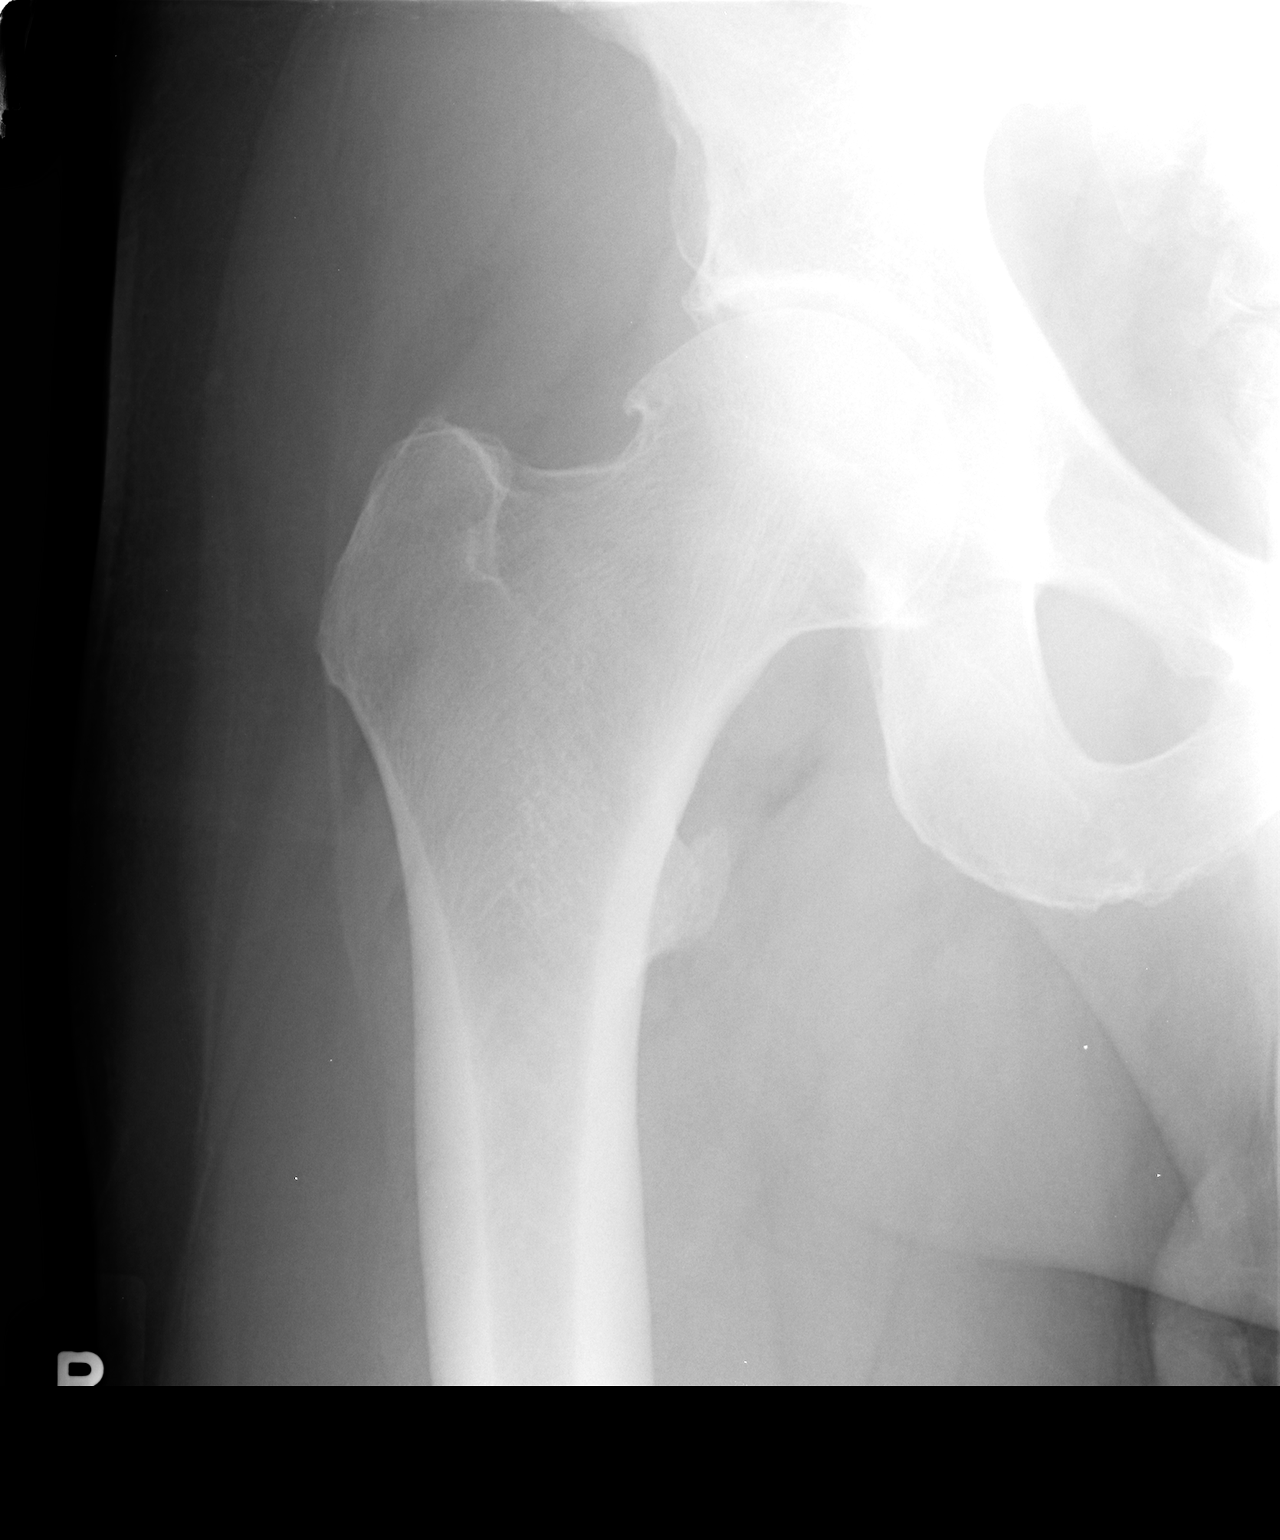

[view not recorded (3 of 3)]
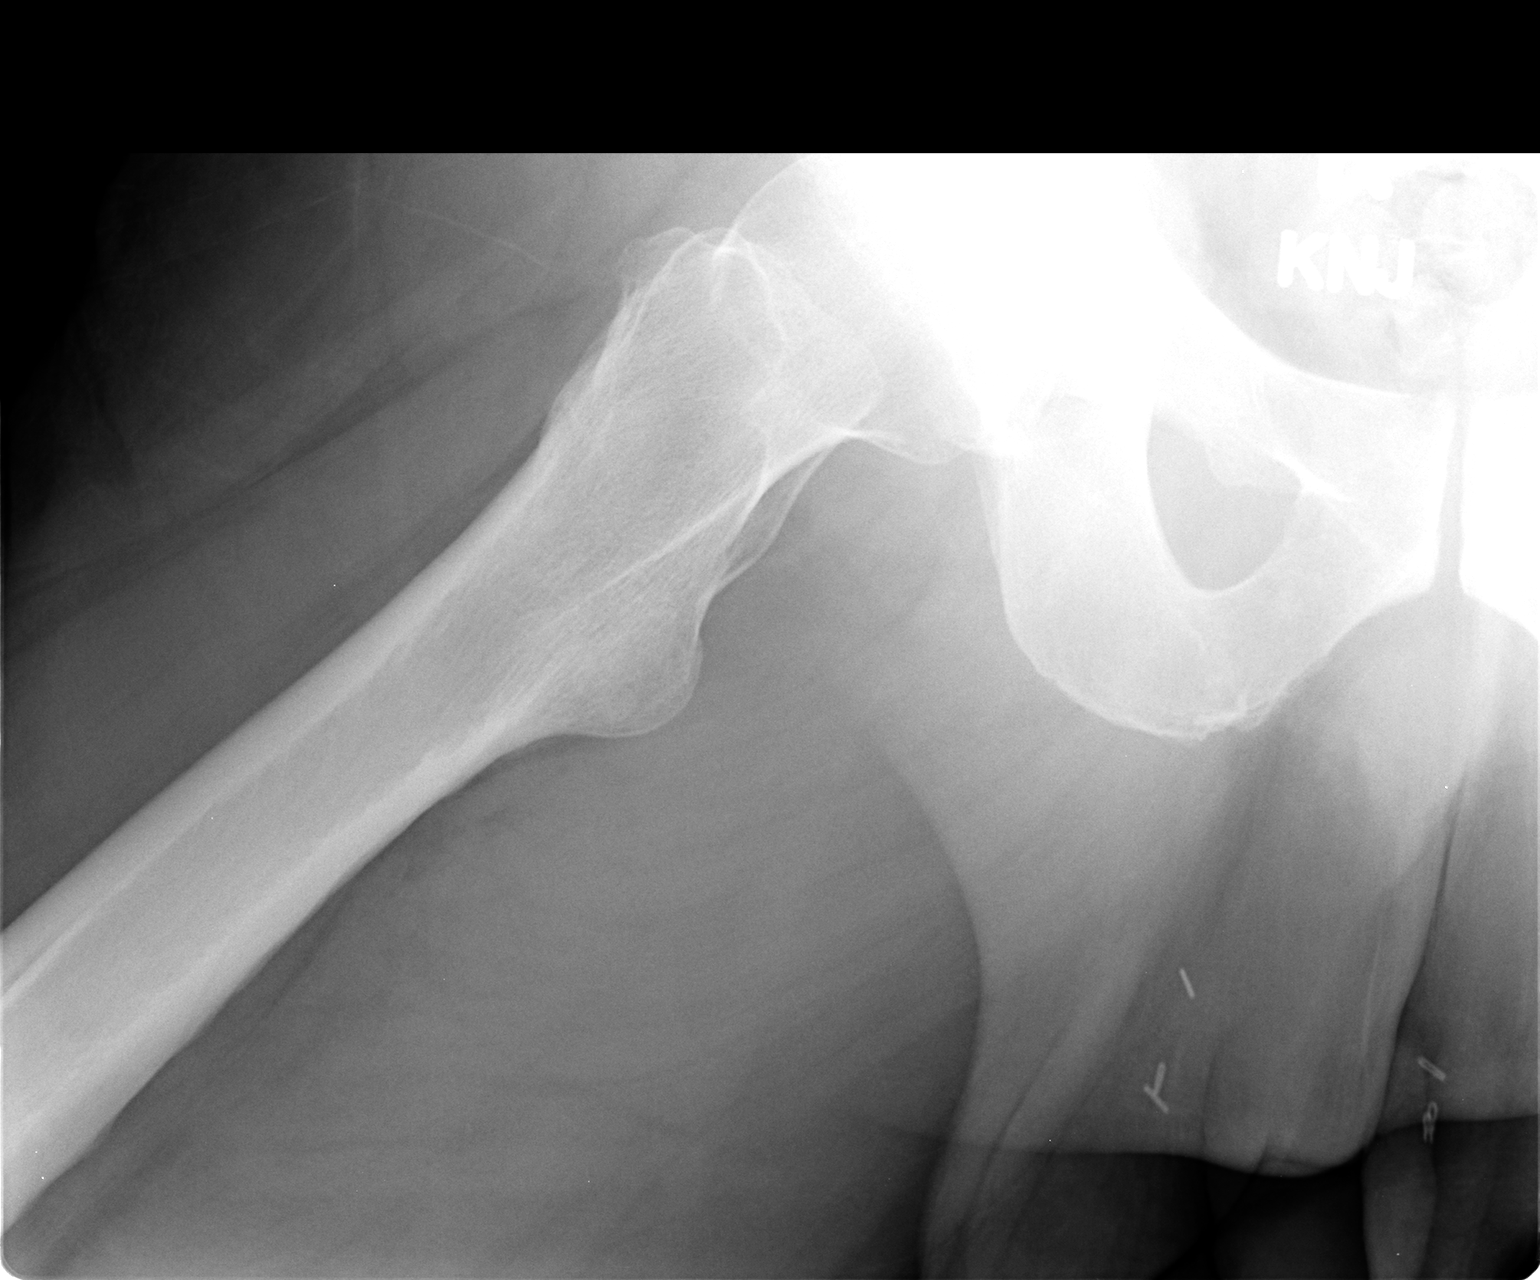

[3 of 3 positions shown; findings below may reference images not displayed]

FINDINGS: There are mild degenerative changes identified involving the hips
bilaterally.

There is no fracture or subluxation identified.IMPRESSION:

Mild DJD.

## 2013-08-29 IMAGING — CR DG LUMBAR SPINE COMPLETE 4+V
5 series · 5 of 5 positions shown · non-contrast
Comparison: None

CLINICAL DATA: Right hip pain

LUMBAR SPINE - COMPLETE 4+ VIEW

[view not recorded (1 of 5)]
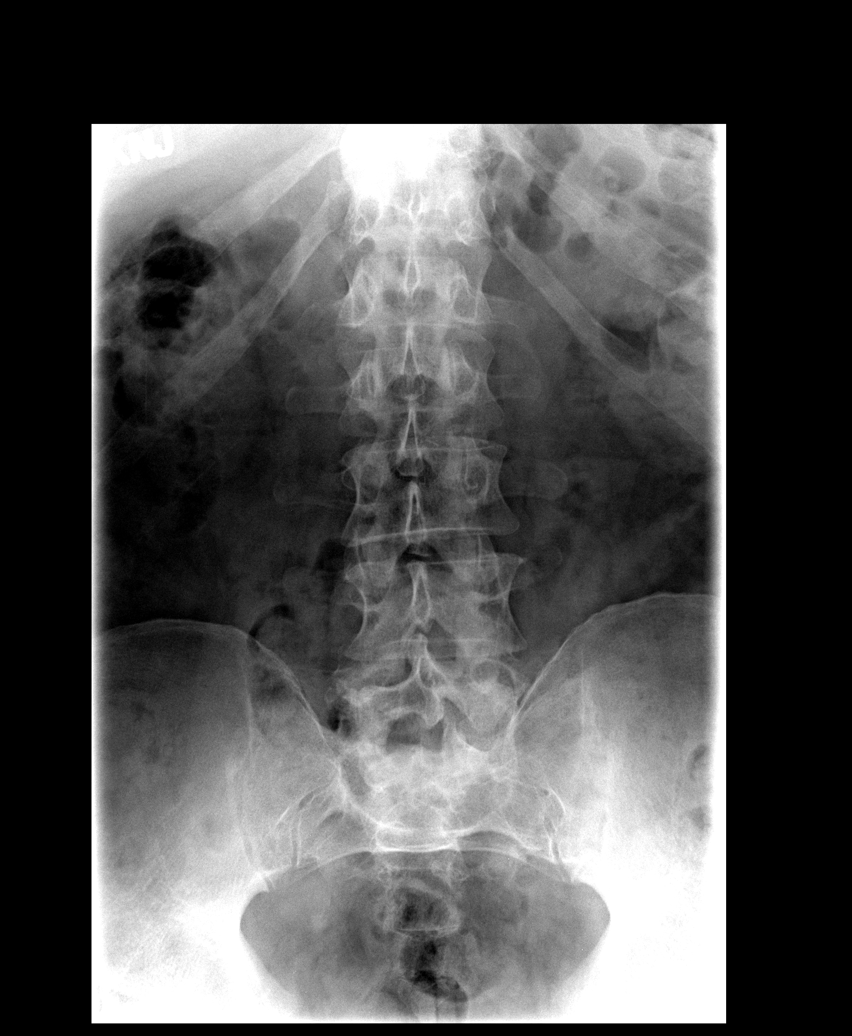

[view not recorded (2 of 5)]
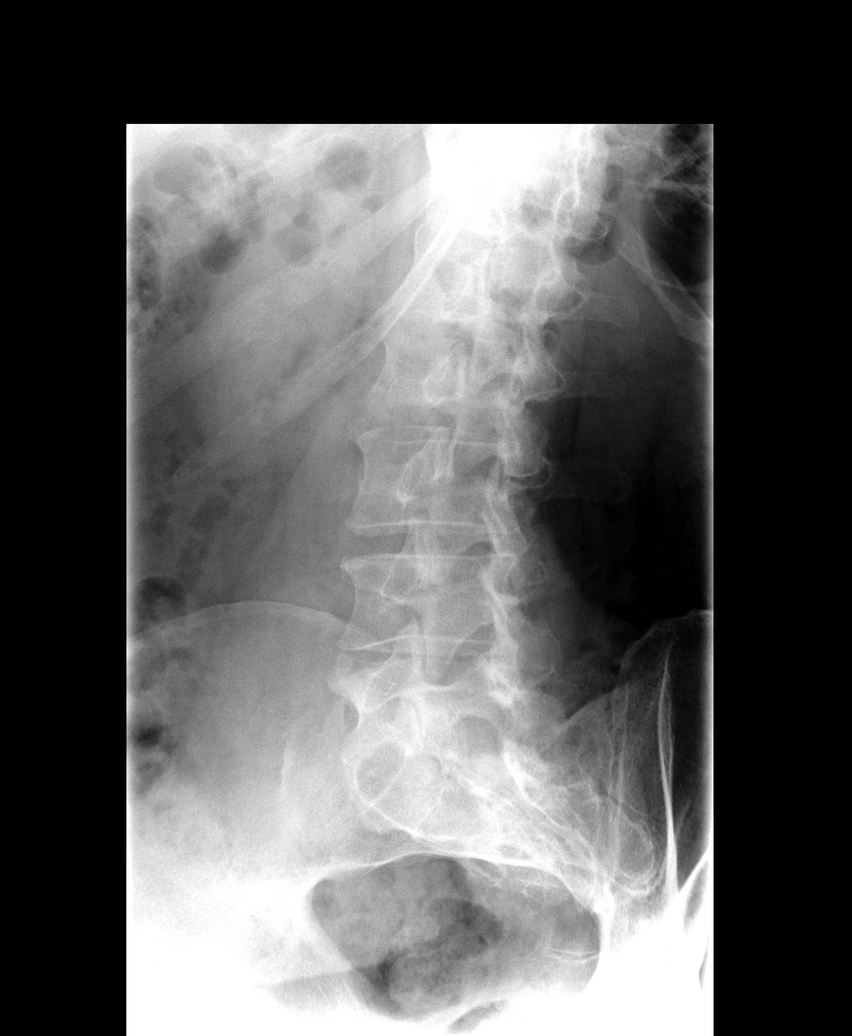

[view not recorded (3 of 5)]
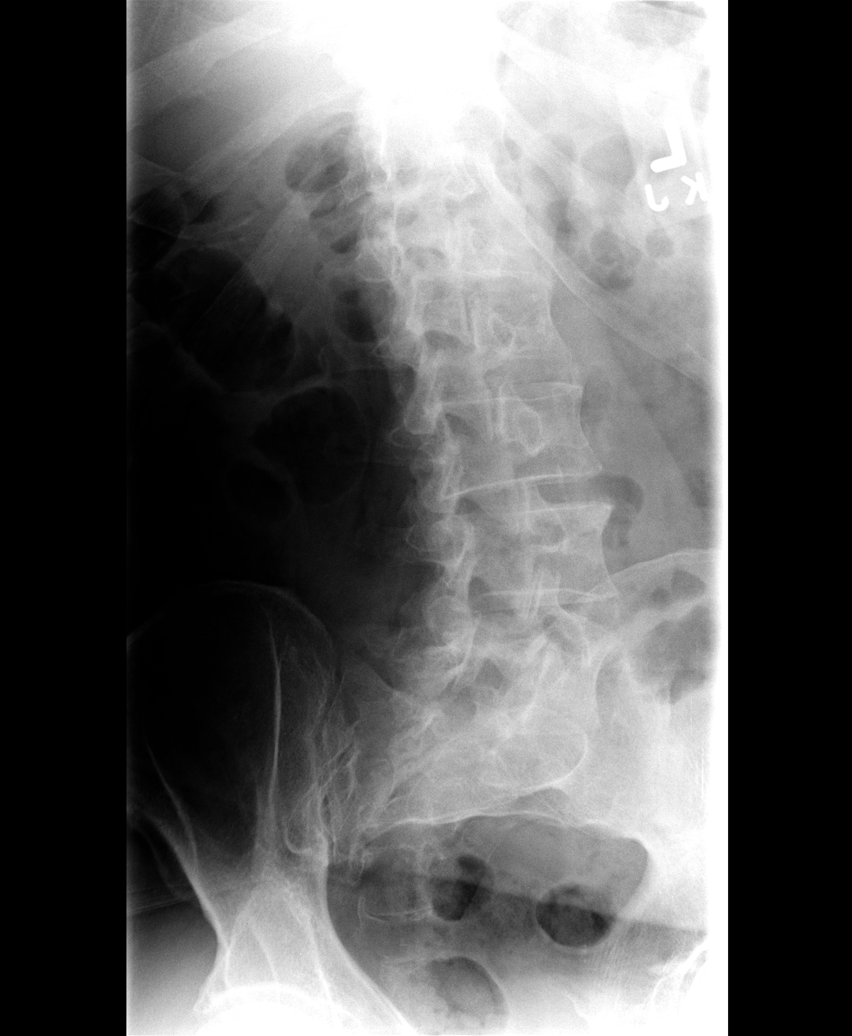

[view not recorded (4 of 5)]
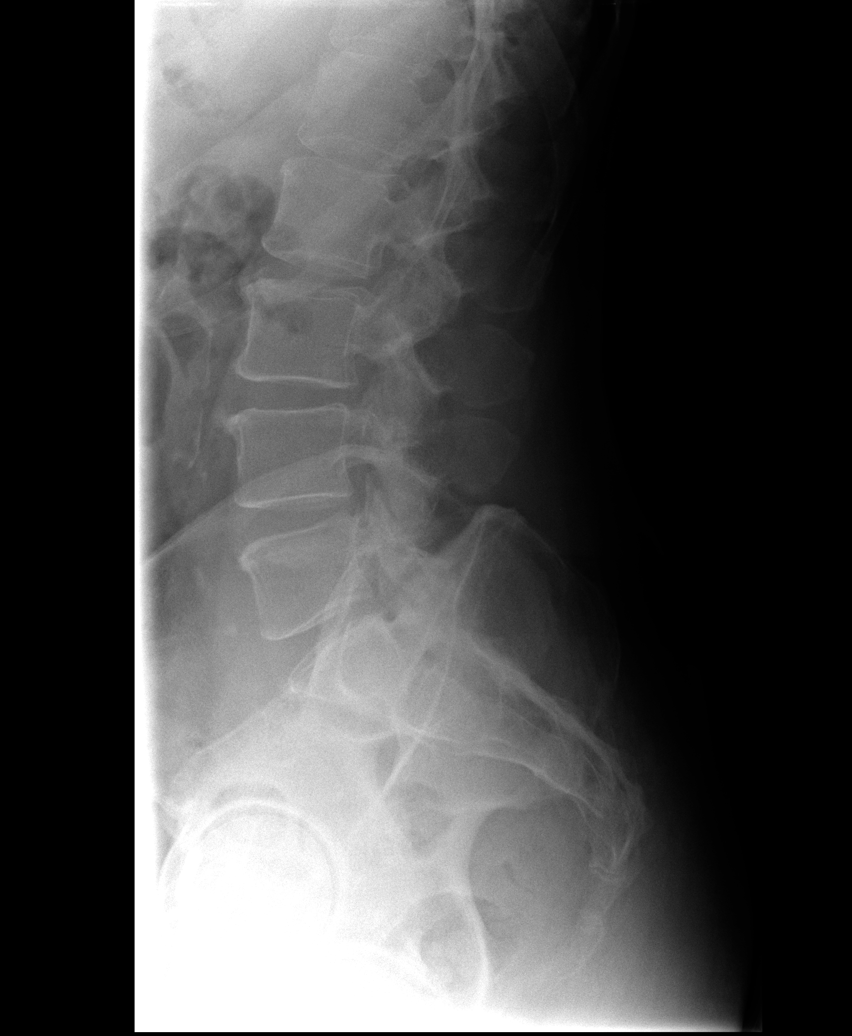

[view not recorded (5 of 5)]
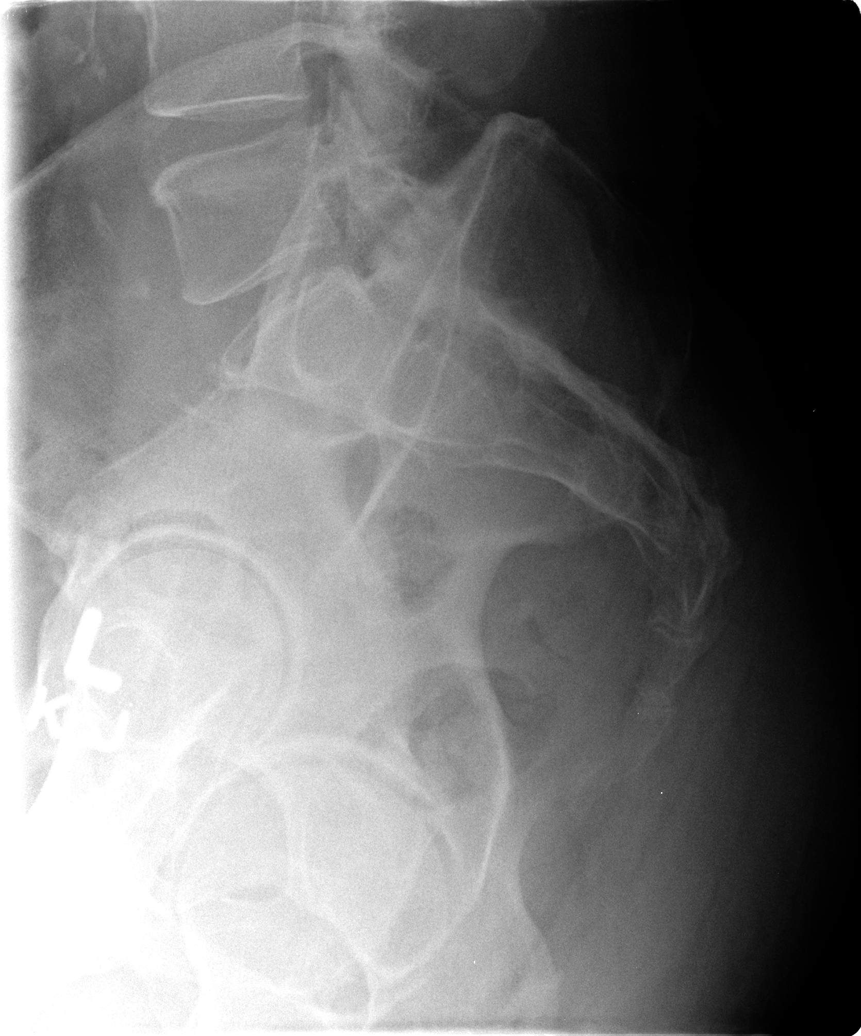

[5 of 5 positions shown; findings below may reference images not displayed]

FINDINGS: Normal alignment of the lumbar spine.

There is mild multilevel disc space narrowing and ventral spurring
identified.

There is no fracture or subluxation identified.
IMPRESSION: 1.  Mild spondylosis.
2.  No acute findings.

## 2013-12-20 ENCOUNTER — Other Ambulatory Visit: Payer: Self-pay | Admitting: Internal Medicine

## 2013-12-23 ENCOUNTER — Other Ambulatory Visit: Payer: Self-pay | Admitting: Internal Medicine

## 2013-12-25 ENCOUNTER — Telehealth: Payer: Self-pay | Admitting: Internal Medicine

## 2013-12-25 MED ORDER — LISINOPRIL 20 MG PO TABS: ORAL_TABLET | ORAL | Status: DC

## 2013-12-25 NOTE — Telephone Encounter (Signed)
Patient is requesting lisinopril to be resent to pharmacy. [name] on file.  [facility] states they did not receive it.

## 2013-12-25 NOTE — Telephone Encounter (Signed)
Pt is due for yearly follow-up will send 30 day until he see md...lmb

## 2014-08-21 DIAGNOSIS — E785 Hyperlipidemia, unspecified: Secondary | ICD-10-CM | POA: Diagnosis not present

## 2014-08-21 DIAGNOSIS — I10 Essential (primary) hypertension: Secondary | ICD-10-CM | POA: Diagnosis not present

## 2014-08-21 DIAGNOSIS — M25551 Pain in right hip: Secondary | ICD-10-CM | POA: Diagnosis not present

## 2014-08-21 DIAGNOSIS — K219 Gastro-esophageal reflux disease without esophagitis: Secondary | ICD-10-CM | POA: Diagnosis not present

## 2014-08-21 DIAGNOSIS — K5900 Constipation, unspecified: Secondary | ICD-10-CM | POA: Diagnosis not present

## 2014-10-19 DIAGNOSIS — H04121 Dry eye syndrome of right lacrimal gland: Secondary | ICD-10-CM | POA: Diagnosis not present

## 2015-02-26 DIAGNOSIS — I10 Essential (primary) hypertension: Secondary | ICD-10-CM | POA: Diagnosis not present

## 2015-02-26 DIAGNOSIS — Z125 Encounter for screening for malignant neoplasm of prostate: Secondary | ICD-10-CM | POA: Diagnosis not present

## 2015-02-26 DIAGNOSIS — Z23 Encounter for immunization: Secondary | ICD-10-CM | POA: Diagnosis not present

## 2015-02-26 DIAGNOSIS — Z136 Encounter for screening for cardiovascular disorders: Secondary | ICD-10-CM | POA: Diagnosis not present

## 2015-02-26 DIAGNOSIS — Z1211 Encounter for screening for malignant neoplasm of colon: Secondary | ICD-10-CM | POA: Diagnosis not present

## 2015-02-26 DIAGNOSIS — K219 Gastro-esophageal reflux disease without esophagitis: Secondary | ICD-10-CM | POA: Diagnosis not present

## 2015-02-26 DIAGNOSIS — Z0000 Encounter for general adult medical examination without abnormal findings: Secondary | ICD-10-CM | POA: Diagnosis not present

## 2015-02-26 DIAGNOSIS — Z1389 Encounter for screening for other disorder: Secondary | ICD-10-CM | POA: Diagnosis not present

## 2015-02-26 DIAGNOSIS — E785 Hyperlipidemia, unspecified: Secondary | ICD-10-CM | POA: Diagnosis not present

## 2015-02-26 DIAGNOSIS — K5900 Constipation, unspecified: Secondary | ICD-10-CM | POA: Diagnosis not present

## 2015-02-26 DIAGNOSIS — K644 Residual hemorrhoidal skin tags: Secondary | ICD-10-CM | POA: Diagnosis not present

## 2015-02-26 DIAGNOSIS — L309 Dermatitis, unspecified: Secondary | ICD-10-CM | POA: Diagnosis not present

## 2015-03-02 ENCOUNTER — Other Ambulatory Visit: Payer: Self-pay | Admitting: Family Medicine

## 2015-03-02 DIAGNOSIS — Z87891 Personal history of nicotine dependence: Secondary | ICD-10-CM

## 2015-03-17 ENCOUNTER — Ambulatory Visit: Payer: Self-pay

## 2015-03-26 ENCOUNTER — Ambulatory Visit: Payer: Self-pay

## 2015-03-26 ENCOUNTER — Ambulatory Visit
Admission: RE | Admit: 2015-03-26 | Discharge: 2015-03-26 | Disposition: A | Discharge status: Home / Self Care | Payer: Medicare Other | Source: Ambulatory Visit | Attending: Family Medicine | Admitting: Family Medicine

## 2015-03-26 DIAGNOSIS — Z87891 Personal history of nicotine dependence: Secondary | ICD-10-CM | POA: Diagnosis not present

## 2015-03-26 DIAGNOSIS — Z136 Encounter for screening for cardiovascular disorders: Secondary | ICD-10-CM | POA: Diagnosis not present

## 2015-03-31 ENCOUNTER — Ambulatory Visit: Payer: Self-pay

## 2015-04-16 DIAGNOSIS — R197 Diarrhea, unspecified: Secondary | ICD-10-CM | POA: Diagnosis not present

## 2015-04-16 DIAGNOSIS — K5900 Constipation, unspecified: Secondary | ICD-10-CM | POA: Diagnosis not present

## 2015-04-16 DIAGNOSIS — K219 Gastro-esophageal reflux disease without esophagitis: Secondary | ICD-10-CM | POA: Diagnosis not present

## 2015-04-16 DIAGNOSIS — K625 Hemorrhage of anus and rectum: Secondary | ICD-10-CM | POA: Diagnosis not present

## 2015-04-16 DIAGNOSIS — L290 Pruritus ani: Secondary | ICD-10-CM | POA: Diagnosis not present

## 2015-04-21 DIAGNOSIS — K4090 Unilateral inguinal hernia, without obstruction or gangrene, not specified as recurrent: Secondary | ICD-10-CM | POA: Diagnosis not present

## 2015-04-21 DIAGNOSIS — N433 Hydrocele, unspecified: Secondary | ICD-10-CM | POA: Diagnosis not present

## 2015-04-21 DIAGNOSIS — Z0000 Encounter for general adult medical examination without abnormal findings: Secondary | ICD-10-CM | POA: Diagnosis not present

## 2015-04-21 DIAGNOSIS — R972 Elevated prostate specific antigen [PSA]: Secondary | ICD-10-CM | POA: Diagnosis not present

## 2015-06-28 DIAGNOSIS — L821 Other seborrheic keratosis: Secondary | ICD-10-CM | POA: Diagnosis not present

## 2015-06-28 DIAGNOSIS — L820 Inflamed seborrheic keratosis: Secondary | ICD-10-CM | POA: Diagnosis not present

## 2015-06-28 DIAGNOSIS — D225 Melanocytic nevi of trunk: Secondary | ICD-10-CM | POA: Diagnosis not present

## 2015-06-28 DIAGNOSIS — D1801 Hemangioma of skin and subcutaneous tissue: Secondary | ICD-10-CM | POA: Diagnosis not present

## 2015-06-28 DIAGNOSIS — L909 Atrophic disorder of skin, unspecified: Secondary | ICD-10-CM | POA: Diagnosis not present

## 2015-06-28 DIAGNOSIS — L812 Freckles: Secondary | ICD-10-CM | POA: Diagnosis not present

## 2015-08-26 DIAGNOSIS — I10 Essential (primary) hypertension: Secondary | ICD-10-CM | POA: Diagnosis not present

## 2015-08-26 DIAGNOSIS — R972 Elevated prostate specific antigen [PSA]: Secondary | ICD-10-CM | POA: Diagnosis not present

## 2015-08-26 DIAGNOSIS — K219 Gastro-esophageal reflux disease without esophagitis: Secondary | ICD-10-CM | POA: Diagnosis not present

## 2015-08-26 DIAGNOSIS — E785 Hyperlipidemia, unspecified: Secondary | ICD-10-CM | POA: Diagnosis not present

## 2015-10-22 DIAGNOSIS — R972 Elevated prostate specific antigen [PSA]: Secondary | ICD-10-CM | POA: Diagnosis not present

## 2015-11-17 DIAGNOSIS — K625 Hemorrhage of anus and rectum: Secondary | ICD-10-CM | POA: Diagnosis not present

## 2015-11-17 DIAGNOSIS — K642 Third degree hemorrhoids: Secondary | ICD-10-CM | POA: Diagnosis not present

## 2015-12-08 DIAGNOSIS — K641 Second degree hemorrhoids: Secondary | ICD-10-CM | POA: Diagnosis not present

## 2015-12-24 DIAGNOSIS — Z23 Encounter for immunization: Secondary | ICD-10-CM | POA: Diagnosis not present

## 2015-12-29 DIAGNOSIS — K641 Second degree hemorrhoids: Secondary | ICD-10-CM | POA: Diagnosis not present

## 2016-01-12 DIAGNOSIS — K641 Second degree hemorrhoids: Secondary | ICD-10-CM | POA: Diagnosis not present

## 2016-02-28 DIAGNOSIS — Z1211 Encounter for screening for malignant neoplasm of colon: Secondary | ICD-10-CM | POA: Diagnosis not present

## 2016-02-28 DIAGNOSIS — K219 Gastro-esophageal reflux disease without esophagitis: Secondary | ICD-10-CM | POA: Diagnosis not present

## 2016-02-28 DIAGNOSIS — E785 Hyperlipidemia, unspecified: Secondary | ICD-10-CM | POA: Diagnosis not present

## 2016-02-28 DIAGNOSIS — Z1389 Encounter for screening for other disorder: Secondary | ICD-10-CM | POA: Diagnosis not present

## 2016-02-28 DIAGNOSIS — Z23 Encounter for immunization: Secondary | ICD-10-CM | POA: Diagnosis not present

## 2016-02-28 DIAGNOSIS — Z0000 Encounter for general adult medical examination without abnormal findings: Secondary | ICD-10-CM | POA: Diagnosis not present

## 2016-02-28 DIAGNOSIS — Z125 Encounter for screening for malignant neoplasm of prostate: Secondary | ICD-10-CM | POA: Diagnosis not present

## 2016-02-28 DIAGNOSIS — I10 Essential (primary) hypertension: Secondary | ICD-10-CM | POA: Diagnosis not present

## 2016-02-28 DIAGNOSIS — R972 Elevated prostate specific antigen [PSA]: Secondary | ICD-10-CM | POA: Diagnosis not present

## 2016-09-19 DIAGNOSIS — I700 Atherosclerosis of aorta: Secondary | ICD-10-CM | POA: Diagnosis not present

## 2016-09-19 DIAGNOSIS — K219 Gastro-esophageal reflux disease without esophagitis: Secondary | ICD-10-CM | POA: Diagnosis not present

## 2016-09-19 DIAGNOSIS — W57XXXA Bitten or stung by nonvenomous insect and other nonvenomous arthropods, initial encounter: Secondary | ICD-10-CM | POA: Diagnosis not present

## 2016-09-19 DIAGNOSIS — E7800 Pure hypercholesterolemia, unspecified: Secondary | ICD-10-CM | POA: Diagnosis not present

## 2016-09-19 DIAGNOSIS — I10 Essential (primary) hypertension: Secondary | ICD-10-CM | POA: Diagnosis not present

## 2016-09-19 DIAGNOSIS — Z6832 Body mass index (BMI) 32.0-32.9, adult: Secondary | ICD-10-CM | POA: Diagnosis not present

## 2016-10-17 DIAGNOSIS — D225 Melanocytic nevi of trunk: Secondary | ICD-10-CM | POA: Diagnosis not present

## 2016-10-17 DIAGNOSIS — L720 Epidermal cyst: Secondary | ICD-10-CM | POA: Diagnosis not present

## 2016-10-17 DIAGNOSIS — L308 Other specified dermatitis: Secondary | ICD-10-CM | POA: Diagnosis not present

## 2016-10-17 DIAGNOSIS — L814 Other melanin hyperpigmentation: Secondary | ICD-10-CM | POA: Diagnosis not present

## 2016-10-17 DIAGNOSIS — D1801 Hemangioma of skin and subcutaneous tissue: Secondary | ICD-10-CM | POA: Diagnosis not present

## 2016-10-17 DIAGNOSIS — L821 Other seborrheic keratosis: Secondary | ICD-10-CM | POA: Diagnosis not present

## 2017-01-01 DIAGNOSIS — Z23 Encounter for immunization: Secondary | ICD-10-CM | POA: Diagnosis not present

## 2017-05-15 DIAGNOSIS — D1801 Hemangioma of skin and subcutaneous tissue: Secondary | ICD-10-CM | POA: Diagnosis not present

## 2017-05-15 DIAGNOSIS — I10 Essential (primary) hypertension: Secondary | ICD-10-CM | POA: Diagnosis not present

## 2017-05-15 DIAGNOSIS — K219 Gastro-esophageal reflux disease without esophagitis: Secondary | ICD-10-CM | POA: Diagnosis not present

## 2017-05-15 DIAGNOSIS — Z8619 Personal history of other infectious and parasitic diseases: Secondary | ICD-10-CM | POA: Diagnosis not present

## 2017-05-15 DIAGNOSIS — E7800 Pure hypercholesterolemia, unspecified: Secondary | ICD-10-CM | POA: Diagnosis not present

## 2017-05-15 DIAGNOSIS — I700 Atherosclerosis of aorta: Secondary | ICD-10-CM | POA: Diagnosis not present

## 2017-05-15 DIAGNOSIS — Z87438 Personal history of other diseases of male genital organs: Secondary | ICD-10-CM | POA: Diagnosis not present

## 2017-05-15 DIAGNOSIS — Z1389 Encounter for screening for other disorder: Secondary | ICD-10-CM | POA: Diagnosis not present

## 2017-05-15 DIAGNOSIS — Z6832 Body mass index (BMI) 32.0-32.9, adult: Secondary | ICD-10-CM | POA: Diagnosis not present

## 2017-12-20 DIAGNOSIS — I700 Atherosclerosis of aorta: Secondary | ICD-10-CM | POA: Diagnosis not present

## 2017-12-20 DIAGNOSIS — Z1211 Encounter for screening for malignant neoplasm of colon: Secondary | ICD-10-CM | POA: Diagnosis not present

## 2017-12-20 DIAGNOSIS — Z0000 Encounter for general adult medical examination without abnormal findings: Secondary | ICD-10-CM | POA: Diagnosis not present

## 2017-12-20 DIAGNOSIS — R400 Somnolence: Secondary | ICD-10-CM | POA: Diagnosis not present

## 2017-12-20 DIAGNOSIS — R972 Elevated prostate specific antigen [PSA]: Secondary | ICD-10-CM | POA: Diagnosis not present

## 2017-12-20 DIAGNOSIS — R682 Dry mouth, unspecified: Secondary | ICD-10-CM | POA: Diagnosis not present

## 2017-12-20 DIAGNOSIS — R3915 Urgency of urination: Secondary | ICD-10-CM | POA: Diagnosis not present

## 2017-12-20 DIAGNOSIS — M79644 Pain in right finger(s): Secondary | ICD-10-CM | POA: Diagnosis not present

## 2017-12-20 DIAGNOSIS — Z1389 Encounter for screening for other disorder: Secondary | ICD-10-CM | POA: Diagnosis not present

## 2017-12-20 DIAGNOSIS — E7800 Pure hypercholesterolemia, unspecified: Secondary | ICD-10-CM | POA: Diagnosis not present

## 2017-12-20 DIAGNOSIS — I10 Essential (primary) hypertension: Secondary | ICD-10-CM | POA: Diagnosis not present

## 2017-12-20 DIAGNOSIS — K219 Gastro-esophageal reflux disease without esophagitis: Secondary | ICD-10-CM | POA: Diagnosis not present

## 2017-12-27 DIAGNOSIS — Z23 Encounter for immunization: Secondary | ICD-10-CM | POA: Diagnosis not present

## 2018-01-22 DIAGNOSIS — Z125 Encounter for screening for malignant neoplasm of prostate: Secondary | ICD-10-CM | POA: Diagnosis not present

## 2018-01-22 DIAGNOSIS — R972 Elevated prostate specific antigen [PSA]: Secondary | ICD-10-CM | POA: Diagnosis not present

## 2018-03-01 DIAGNOSIS — R972 Elevated prostate specific antigen [PSA]: Secondary | ICD-10-CM | POA: Diagnosis not present

## 2018-04-17 DIAGNOSIS — R972 Elevated prostate specific antigen [PSA]: Secondary | ICD-10-CM | POA: Diagnosis not present

## 2018-04-17 DIAGNOSIS — C61 Malignant neoplasm of prostate: Secondary | ICD-10-CM | POA: Diagnosis not present

## 2018-04-17 DIAGNOSIS — D075 Carcinoma in situ of prostate: Secondary | ICD-10-CM | POA: Diagnosis not present

## 2018-06-03 DIAGNOSIS — C61 Malignant neoplasm of prostate: Secondary | ICD-10-CM | POA: Diagnosis not present

## 2018-06-10 DIAGNOSIS — C61 Malignant neoplasm of prostate: Secondary | ICD-10-CM | POA: Diagnosis not present

## 2018-06-17 ENCOUNTER — Encounter: Payer: Self-pay | Admitting: Radiation Oncology

## 2018-06-17 NOTE — Progress Notes (Signed)
GU Location of Tumor / Histology: prostatic adenocarcinoma  If Prostate Cancer, Gleason Score is (3 + 4) and PSA is (4.19). Prostate volume: 62.40 ml.  Juan Trevino was initially referred by Dr. Moreen Fowler in early 2017 for elevated PSA. At that time, PSA in late 2016 was 3.4 with no significant LUTS. PSA was recheck by Dr. Moreen Fowler in May of the following year and returned 2.97. Then, in November 2019 his PSA was 4.19.  Biopsies of prostate (if applicable) revealed:    Past/Anticipated interventions by urology, if any: prostate biopsy, referred for consideration of radiation therapy and with strong encouragement to consider definitive therapy.  Past/Anticipated interventions by medical oncology, if any: no  Weight changes, if any: no  Bowel/Bladder complaints, if any: Denies dysuria or hematuria. Denies leakage or incontinence.  Nausea/Vomiting, if any: no  Pain issues, if any:  No   SAFETY ISSUES:  Prior radiation? NO  Pacemaker/ICD? NO  Possible current pregnancy? no, male patient   Is the patient on methotrexate? NO  Current Complaints / other details:  68 year old male. NKDA. Married with two children. HX of umbilical hernia repair.   Reports in the last week or two he met with [name] to discuss his surgical options.

## 2018-06-18 ENCOUNTER — Ambulatory Visit
Admission: RE | Admit: 2018-06-18 | Discharge: 2018-06-18 | Disposition: A | Discharge status: Home / Self Care | Payer: Medicare Other | Source: Ambulatory Visit | Attending: Radiation Oncology | Admitting: Radiation Oncology

## 2018-06-18 ENCOUNTER — Encounter: Payer: Self-pay | Admitting: Radiation Oncology

## 2018-06-18 ENCOUNTER — Other Ambulatory Visit: Payer: Self-pay

## 2018-06-18 VITALS — BP 147/80 | HR 71 | Temp 98.3°F | Resp 18 | Ht 72.0 in | Wt 221.4 lb

## 2018-06-18 DIAGNOSIS — Z79899 Other long term (current) drug therapy: Secondary | ICD-10-CM | POA: Diagnosis not present

## 2018-06-18 DIAGNOSIS — K219 Gastro-esophageal reflux disease without esophagitis: Secondary | ICD-10-CM | POA: Insufficient documentation

## 2018-06-18 DIAGNOSIS — E785 Hyperlipidemia, unspecified: Secondary | ICD-10-CM | POA: Diagnosis not present

## 2018-06-18 DIAGNOSIS — C61 Malignant neoplasm of prostate: Secondary | ICD-10-CM

## 2018-06-18 DIAGNOSIS — Z801 Family history of malignant neoplasm of trachea, bronchus and lung: Secondary | ICD-10-CM | POA: Insufficient documentation

## 2018-06-18 DIAGNOSIS — Z8041 Family history of malignant neoplasm of ovary: Secondary | ICD-10-CM | POA: Insufficient documentation

## 2018-06-18 DIAGNOSIS — Z800 Family history of malignant neoplasm of digestive organs: Secondary | ICD-10-CM | POA: Diagnosis not present

## 2018-06-18 DIAGNOSIS — I10 Essential (primary) hypertension: Secondary | ICD-10-CM | POA: Diagnosis not present

## 2018-06-18 DIAGNOSIS — Z803 Family history of malignant neoplasm of breast: Secondary | ICD-10-CM | POA: Diagnosis not present

## 2018-06-18 DIAGNOSIS — Z9079 Acquired absence of other genital organ(s): Secondary | ICD-10-CM | POA: Diagnosis not present

## 2018-06-18 DIAGNOSIS — Z87891 Personal history of nicotine dependence: Secondary | ICD-10-CM | POA: Diagnosis not present

## 2018-06-18 DIAGNOSIS — K5792 Diverticulitis of intestine, part unspecified, without perforation or abscess without bleeding: Secondary | ICD-10-CM | POA: Insufficient documentation

## 2018-06-18 DIAGNOSIS — R972 Elevated prostate specific antigen [PSA]: Secondary | ICD-10-CM | POA: Diagnosis not present

## 2018-06-18 DIAGNOSIS — Z8042 Family history of malignant neoplasm of prostate: Secondary | ICD-10-CM | POA: Diagnosis not present

## 2018-06-18 HISTORY — DX: Malignant neoplasm of prostate: C61

## 2018-06-18 NOTE — Progress Notes (Signed)
See progress note under physician encounter. 

## 2018-06-18 NOTE — Progress Notes (Signed)
Radiation Oncology         ([phone]) [phone] ________________________________  Initial Outpatient Consultation  Name: Juan Trevino MRN: [id number]  Date: 06/18/2018  DOB: 1950-06-29  WT:UUEKCM, Juan Brow, MD  Juan Gallo, MD   REFERRING PHYSICIAN: [name], MD  DIAGNOSIS: 68 y.o. gentleman with Stage T2a adenocarcinoma of the prostate with Gleason score of 3+4, and PSA of 4.19.    ICD-10-CM   1. Malignant neoplasm of prostate ([place]) C61     HISTORY OF PRESENT ILLNESS: Juan Trevino is a 68 y.o. male with a diagnosis of prostate cancer. He was initially referred to urology in early 2017 for PSA of 3.4.  No significant LUTS were noted at that time.  His PSA was rechecked in May 2017 and returned to 2.97.  He was again noted to have an elevated PSA of 4.19 in October 2019 by his primary care physician, Dr. Antony Trevino.  Accordingly, he was referred back for evaluation in urology to Dr. Diona Trevino on 03/01/2018, and a digital rectal examination was performed at that time revealing a 4 mm right base prostate nodule.  The patient proceeded to transrectal ultrasound with 12 biopsies of the prostate on 04/17/2018.  The prostate volume measured 62.40 cc.  Out of 12 core biopsies, 5 were positive.  The maximum Gleason score was 3+4, and this was seen in the left mid lateral, left apex lateral, left apex, and right apex lateral.  Additionally, there was Gleason 3+3 disease seen in the right mid lateral.  Biopsies of prostate revealed:    The patient reviewed the biopsy results with his urologist and he has kindly been referred today for discussion of potential radiation treatment options. He met with Dr. Tresa Trevino to discuss RALP 1 week ago.   PREVIOUS RADIATION THERAPY: No  PAST MEDICAL HISTORY:  Past Medical History:  Diagnosis Date  . Diverticulitis 2005  . GERD (gastroesophageal reflux disease)   . Hyperlipidemia   . Hypertension   . Prostate cancer ([place])   . Thyroid disease       PAST  SURGICAL HISTORY: Past Surgical History:  Procedure Laterality Date  . HERNIA REPAIR     umbilical  . PROSTATE BIOPSY      FAMILY HISTORY:  Family History  Problem Relation Age of Onset  . Colon cancer Mother   . Hypertension Mother   . Prostate cancer Father   . Lung cancer Father   . Hypertension Father   . Breast cancer Neg Hx   . Pancreatic cancer Neg Hx     SOCIAL HISTORY:  Social History   Socioeconomic History  . Marital status: Married    Spouse name: Not on file  . Number of children: 2  . Years of education: Not on file  . Highest education level: Not on file  Occupational History  . Not on file  Social Needs  . Financial resource strain: Not on file  . Food insecurity:    Worry: Not on file    Inability: Not on file  . Transportation needs:    Medical: Not on file    Non-medical: Not on file  Tobacco Use  . Smoking status: Former Smoker    Packs/day: 1.50    Years: 18.00    Pack years: 27.00    Types: Cigarettes    Last attempt to quit: 01/29/1986    Years since quitting: 32.4  . Smokeless tobacco: Never Used  Substance and Sexual Activity  . Alcohol use: Yes  Alcohol/week: 8.0 standard drinks    Types: 8 Cans of beer per week  . Drug use: No  . Sexual activity: Yes  Lifestyle  . Physical activity:    Days per week: Not on file    Minutes per session: Not on file  . Stress: Not on file  Relationships  . Social connections:    Talks on phone: Not on file    Gets together: Not on file    Attends religious service: Not on file    Active member of club or organization: Not on file    Attends meetings of clubs or organizations: Not on file    Relationship status: Not on file  . Intimate partner violence:    Fear of current or ex partner: Not on file    Emotionally abused: Not on file    Physically abused: Not on file    Forced sexual activity: Not on file  Other Topics Concern  . Not on file  Social History Narrative   Caffienated  drinks-yes   Seat belt use often-yes   Regular Exercise-yes   Smoke alarm in the home-yes      Resides in [place]. Retired.             ALLERGIES: Patient has no known allergies.  MEDICATIONS:  Current Outpatient Medications  Medication Sig Dispense Refill  . amLODipine (NORVASC) 5 MG tablet     . lisinopril (PRINIVIL,ZESTRIL) 40 MG tablet     . valACYclovir (VALTREX) 500 MG tablet Take 500 mg by mouth 2 (two) times daily. Prn for cold sores     No current facility-administered medications for this encounter.     REVIEW OF SYSTEMS:  On review of systems, the patient reports that he is doing well overall. He denies any chest pain, shortness of breath, cough, fevers, chills, night sweats, or unintended weight changes. He denies any bowel disturbances, and denies abdominal pain, nausea or vomiting. He denies any new musculoskeletal or joint aches or pains. His IPSS was 6, indicating mild urinary symptoms. He denies dysuria, hematuria, leakage or incontinence. His SHIM was 19, indicating he does have mild erectile dysfunction. A complete review of systems is obtained and is otherwise negative.    PHYSICAL EXAM:  Wt Readings from Last 3 Encounters:  06/18/18 221 lb 6 oz (100.4 kg)  11/06/12 236 lb (107 kg)  11/06/11 233 lb (105.7 kg)   Temp Readings from Last 3 Encounters:  06/18/18 98.3 F (36.8 C) (Oral)  11/06/12 98.7 F (37.1 C) (Oral)  11/06/11 98.2 F (36.8 C) (Oral)   BP Readings from Last 3 Encounters:  06/18/18 (!) 147/80  11/06/12 138/68  11/06/11 118/66   Pulse Readings from Last 3 Encounters:  06/18/18 71  11/06/12 88  11/06/11 70   Pain Assessment Pain Score: 0-No pain/10  In general this is a well appearing caucasian male in no acute distress. He is alert and oriented x4 and appropriate throughout the examination. HEENT reveals that the patient is normocephalic, atraumatic. EOMs are intact. PERRLA. Skin is intact without any evidence of gross lesions.  Cardiovascular exam reveals a regular rate and rhythm, no clicks rubs or murmurs are auscultated. Chest is clear to auscultation bilaterally. Lymphatic assessment is performed and does not reveal any adenopathy in the cervical, supraclavicular, axillary, or inguinal chains. Abdomen has active bowel sounds in all quadrants and is intact. The abdomen is soft, non tender, non distended. Lower extremities are negative for pretibial pitting edema, deep calf  tenderness, cyanosis or clubbing.   KPS = 90  100 - Normal; no complaints; no evidence of disease. 90   - Able to carry on normal activity; minor signs or symptoms of disease. 80   - Normal activity with effort; some signs or symptoms of disease. 54   - Cares for self; unable to carry on normal activity or to do active work. 60   - Requires occasional assistance, but is able to care for most of his personal needs. 50   - Requires considerable assistance and frequent medical care. 30   - Disabled; requires special care and assistance. 28   - Severely disabled; hospital admission is indicated although death not imminent. 74   - Very sick; hospital admission necessary; active supportive treatment necessary. 10   - Moribund; fatal processes progressing rapidly. 0     - Dead  Karnofsky DA, Abelmann [place], Craver LS and [facility] [phone]) The use of the nitrogen mustards in the palliative treatment of carcinoma: with particular reference to bronchogenic carcinoma Cancer 1 634-56  LABORATORY DATA:  Lab Results  Component Value Date   WBC 8.4 11/06/2012   HGB 14.3 11/06/2012   HCT 42.0 11/06/2012   MCV 90.6 11/06/2012   PLT 215.0 11/06/2012   Lab Results  Component Value Date   NA 139 11/06/2012   K 4.4 11/06/2012   CL 103 11/06/2012   CO2 29 11/06/2012   Lab Results  Component Value Date   ALT 26 11/06/2012   AST 18 11/06/2012   ALKPHOS 44 11/06/2012   BILITOT 0.9 11/06/2012     RADIOGRAPHY: No results found.     IMPRESSION/PLAN: 1. 68 y.o. gentleman with Stage T2a adenocarcinoma of the prostate with Gleason Score of 3+4, and PSA of 4.19. We discussed the patient's workup and outlined the nature of prostate cancer in this setting. The patient's T stage, Gleason's score, and PSA put him into the favorable-intermediate risk group. Accordingly, he is eligible for a variety of potential treatment options including prostatectomy, brachytherapy, or 5.5 weeks of external radiation. We discussed the available radiation techniques, and focused on the details of logistics and delivery. We discussed and outlined the risks, benefits, short and long-term effects associated with radiotherapy and compared and contrasted these with prostatectomy. We discussed the role of SpaceOAR in reducing the rectal toxicity associated with radiotherapy. He was encouraged to ask questions that were answered to his stated satisfaction.  At the end of the conversation the patient is undecided regarding his final treatment preference and would like some additional time to consider his options. We will plan to follow up with him in the near future and will move forward with treatment planning accordingly at that time. He was encouraged to call with any additional questions.  We spent 65 minutes face to face with the patient and more than 50% of that time was spent in counseling and/or coordination of care.    Nicholos Johns, PA-C    Tyler Pita, MD  [facility] Direct Dial: [phone]  Fax: [phone] [email or web address]  Skype  [email or web address]  This document serves as a record of services personally performed by Tyler Pita, MD and Freeman Caldron, PA-C. It was created on their behalf by Rae Lips, a trained medical scribe. The creation of this record is based on the scribe's personal observations and the providers' statements to them. This document has been checked and approved by the attending  providers.

## 2018-06-25 ENCOUNTER — Other Ambulatory Visit: Payer: Self-pay | Admitting: Urology

## 2018-06-28 DIAGNOSIS — I10 Essential (primary) hypertension: Secondary | ICD-10-CM | POA: Diagnosis not present

## 2018-06-28 DIAGNOSIS — K219 Gastro-esophageal reflux disease without esophagitis: Secondary | ICD-10-CM | POA: Diagnosis not present

## 2018-06-28 DIAGNOSIS — I700 Atherosclerosis of aorta: Secondary | ICD-10-CM | POA: Diagnosis not present

## 2018-06-28 DIAGNOSIS — C61 Malignant neoplasm of prostate: Secondary | ICD-10-CM | POA: Diagnosis not present

## 2018-06-28 DIAGNOSIS — E7800 Pure hypercholesterolemia, unspecified: Secondary | ICD-10-CM | POA: Diagnosis not present

## 2018-07-09 ENCOUNTER — Telehealth: Payer: Self-pay | Admitting: Medical Oncology

## 2018-07-09 NOTE — Telephone Encounter (Signed)
Left message requesting a return call to discuss his treatment decision regarding his prostate cancer. I was unable to meet him 3/10 when he consulted with Dr. Tammi Klippel so I introduced myself as the prostate nurse navigator and my role.He is scheduled at [facility] for robotic prostatectomy 5/6 with [name]. Once I confirm, I will notify Dr. Tammi Klippel and Ailene Ards. I informed him I am working remotely and asked him to leave me a message on my office phone to confirm the above or if he has questions or concerns, I will be happy to call him back.

## 2018-07-11 ENCOUNTER — Telehealth: Payer: Self-pay | Admitting: Medical Oncology

## 2018-07-11 NOTE — Telephone Encounter (Signed)
Juan Trevino called stating he has decided on robotic prostatectomy to treat his prostate cancer. He is scheduled for May 6th. I wished him well and asked him to call if I can be of assistance in the future.

## 2018-08-06 DIAGNOSIS — C61 Malignant neoplasm of prostate: Secondary | ICD-10-CM | POA: Diagnosis not present

## 2018-08-06 NOTE — Patient Instructions (Addendum)
Juan Trevino  08/06/2018   Your procedure is scheduled on: 08-14-2018   Report to [facility] Main  Entrance     Report to admitting at 10:00AM    Call this number if you have problems the morning of surgery [phone]      Remember: Do not eat food or drink liquids :After Midnight. BRUSH YOUR TEETH MORNING OF SURGERY AND RINSE YOUR MOUTH OUT, NO CHEWING GUM CANDY OR MINTS.     Take these medicines the morning of surgery with A SIP OF WATER: Amlodipine                                  You may not have any metal on your body including hair pins and              piercings  Do not wear jewelry, make-up, lotions, powders or perfumes, deodorant             Do not wear nail polish.  Do not shave  48 hours prior to surgery.              Men may shave face and neck.   Do not bring valuables to the hospital. [name].  Contacts, dentures or bridgework may not be worn into surgery.  Leave suitcase in the car. After surgery it may be brought to your room.     Patients discharged the day of surgery will not be allowed to drive home. IF YOU ARE HAVING SURGERY AND GOING HOME THE SAME DAY, YOU MUST HAVE AN ADULT TO DRIVE YOU HOME AND BE WITH YOU FOR 24 HOURS. YOU MAY GO HOME BY TAXI OR UBER OR ORTHERWISE, BUT AN ADULT MUST ACCOMPANY YOU HOME AND STAY WITH YOU FOR 24 HOURS.  Name and phone number of your driver:  Special Instructions: N/A              Please read over the following fact sheets you were given: _____________________________________________________________________             [facility] - Preparing for Surgery Before surgery, you can play an important role.  Because skin is not sterile, your skin needs to be as free of germs as possible.  You can reduce the number of germs on your skin by washing with CHG (chlorahexidine gluconate) soap before surgery.  CHG is an antiseptic cleaner which kills germs and  bonds with the skin to continue killing germs even after washing. Please DO NOT use if you have an allergy to CHG or antibacterial soaps.  If your skin becomes reddened/irritated stop using the CHG and inform your nurse when you arrive at Short Stay. Do not shave (including legs and underarms) for at least 48 hours prior to the first CHG shower.  You may shave your face/neck. Please follow these instructions carefully:  1.  Shower with CHG Soap the night before surgery and the  morning of Surgery.  2.  If you choose to wash your hair, wash your hair first as usual with your  normal  shampoo.  3.  After you shampoo, rinse your hair and body thoroughly to remove the  shampoo.  4.  Use CHG as you would any other liquid soap.  You can apply chg directly  to the skin and wash                       Gently with a scrungie or clean washcloth.  5.  Apply the CHG Soap to your body ONLY FROM THE NECK DOWN.   Do not use on face/ open                           Wound or open sores. Avoid contact with eyes, ears mouth and genitals (private parts).                       Wash face,  Genitals (private parts) with your normal soap.             6.  Wash thoroughly, paying special attention to the area where your surgery  will be performed.  7.  Thoroughly rinse your body with warm water from the neck down.  8.  DO NOT shower/wash with your normal soap after using and rinsing off  the CHG Soap.                9.  Pat yourself dry with a clean towel.            10.  Wear clean pajamas.            11.  Place clean sheets on your bed the night of your first shower and do not  sleep with pets. Day of Surgery : Do not apply any lotions/deodorants the morning of surgery.  Please wear clean clothes to the hospital/surgery center.  FAILURE TO FOLLOW THESE INSTRUCTIONS MAY RESULT IN THE CANCELLATION OF YOUR SURGERY PATIENT SIGNATURE_________________________________  NURSE  SIGNATURE__________________________________  ________________________________________________________________________

## 2018-08-06 NOTE — Progress Notes (Signed)
SPOKE W/  _  Pt via phone     SCREENING SYMPTOMS OF COVID 19:   COUGH-- No  RUNNY NOSE---  No  SORE THROAT--- No  NASAL CONGESTION---- NO  SNEEZING---- NO  SHORTNESS OF BREATH--- NO  DIFFICULTY BREATHING--- No  TEMP >100.0 ----- No  UNEXPLAINED BODY ACHES------ NO  CHILLS --------  NO  HEADACHES --------- NO  LOSS OF SMELL/ TASTE -------- No    HAVE YOU OR ANY FAMILY MEMBER TRAVELLED PAST 14 DAYS OUT OF THE   COUNTY--- No STATE---- NO COUNTRY---- NO  HAVE YOU OR ANY FAMILY MEMBER BEEN EXPOSED TO ANYONE WITH COVID 19?  denies

## 2018-08-07 ENCOUNTER — Inpatient Hospital Stay (HOSPITAL_COMMUNITY): Admission: RE | Admit: 2018-08-07 | Payer: Medicare Other | Source: Ambulatory Visit

## 2018-08-07 ENCOUNTER — Encounter (HOSPITAL_COMMUNITY)
Admission: RE | Admit: 2018-08-07 | Discharge: 2018-08-07 | Disposition: A | Discharge status: Home / Self Care | Payer: Medicare Other | Source: Ambulatory Visit | Attending: Urology | Admitting: Urology

## 2018-08-07 ENCOUNTER — Other Ambulatory Visit: Payer: Self-pay

## 2018-08-07 ENCOUNTER — Encounter (HOSPITAL_COMMUNITY): Payer: Self-pay

## 2018-08-07 DIAGNOSIS — C61 Malignant neoplasm of prostate: Secondary | ICD-10-CM | POA: Insufficient documentation

## 2018-08-07 DIAGNOSIS — Z01818 Encounter for other preprocedural examination: Secondary | ICD-10-CM | POA: Diagnosis not present

## 2018-08-07 DIAGNOSIS — M6281 Muscle weakness (generalized): Secondary | ICD-10-CM | POA: Diagnosis not present

## 2018-08-07 DIAGNOSIS — R001 Bradycardia, unspecified: Secondary | ICD-10-CM | POA: Insufficient documentation

## 2018-08-07 DIAGNOSIS — I10 Essential (primary) hypertension: Secondary | ICD-10-CM | POA: Diagnosis not present

## 2018-08-07 DIAGNOSIS — N3941 Urge incontinence: Secondary | ICD-10-CM | POA: Diagnosis not present

## 2018-08-07 LAB — CBC
HCT: 43.4 % (ref 39.0–52.0)
Hemoglobin: 13.9 g/dL (ref 13.0–17.0)
MCH: 30.8 pg (ref 26.0–34.0)
MCHC: 32 g/dL (ref 30.0–36.0)
MCV: 96.2 fL (ref 80.0–100.0)
Platelets: 183 10*3/uL (ref 150–400)
RBC: 4.51 MIL/uL (ref 4.22–5.81)
RDW: 13 % (ref 11.5–15.5)
WBC: 5.2 10*3/uL (ref 4.0–10.5)
nRBC: 0 % (ref 0.0–0.2)

## 2018-08-07 LAB — BASIC METABOLIC PANEL
Anion gap: 5 (ref 5–15)
BUN: 15 mg/dL (ref 8–23)
CO2: 27 mmol/L (ref 22–32)
Calcium: 9.3 mg/dL (ref 8.9–10.3)
Chloride: 106 mmol/L (ref 98–111)
Creatinine, Ser: 0.83 mg/dL (ref 0.61–1.24)
GFR calc Af Amer: >60 mL/min (ref >60)
GFR calc non Af Amer: >60 mL/min (ref >60)
Glucose, Bld: 89 mg/dL (ref 70–99)
Potassium: 4.6 mmol/L (ref 3.5–5.1)
Sodium: 138 mmol/L (ref 135–145)

## 2018-08-08 NOTE — Progress Notes (Signed)
Anesthesia Chart Review   Case:  [id number] Date/Time:  08/14/18 1145   Procedures:      XI ROBOTIC ASSISTED LAPAROSCOPIC RADICAL PROSTATECTOMY (N/A ) - 3 HRS     LYMPHADENECTOMY (Bilateral )   Anesthesia type:  General   Pre-op diagnosis:  PROSTATE CANCER   Location:  [place] [facility] / [facility]   Surgeon:  Alexis Frock, MD      DISCUSSION: 68 yo former smoker (27 pack years, quit 01/29/86) with h/o GERD, HLD, thyroid disease, HTN, prostate cancer scheduled for above procedure 08/14/18 with Dr. Alexis Frock.   Pt can proceed with planned procedure barring acute status change.  VS: BP 137/61   Pulse 64   Temp 36.4 C (Oral)   Resp 16   Ht 6' (1.829 m)   Wt 96.4 kg   SpO2 100%   BMI 28.82 kg/m   PROVIDERS: Antony Contras, MD is PCP    LABS: Labs reviewed: Acceptable for surgery. (all labs ordered are listed, but only abnormal results are displayed)  Labs Reviewed  CBC  BASIC METABOLIC PANEL     IMAGES:   EKG: 08/07/2018 Rate 56 bpm Sinus bradycardia Otherwise normal ECG   CV:  Past Medical History:  Diagnosis Date  . Diverticulitis 2005  . GERD (gastroesophageal reflux disease)   . Hyperlipidemia   . Hypertension   . Prostate cancer ([place])   . Thyroid disease     Past Surgical History:  Procedure Laterality Date  . colonoscopy     . HERNIA REPAIR     umbilical  . hydrocelectomy     . PROSTATE BIOPSY    . UPPER GI ENDOSCOPY    . vasectomy      MEDICATIONS: . amLODipine (NORVASC) 5 MG tablet  . Ascorbic Acid (VITAMIN C) 1000 MG tablet  . Cholecalciferol (VITAMIN D) 50 MCG (2000 UT) CAPS  . Green Tea, Camellia sinensis, (GREEN TEA EXTRACT PO)  . lisinopril (PRINIVIL,ZESTRIL) 40 MG tablet  . Omega-3 Fatty Acids (FISH OIL) 1200 MG CAPS  . Saw Palmetto 450 MG CAPS  . valACYclovir (VALTREX) 500 MG tablet  . Zinc 30 MG TABS   No current facility-administered medications for this encounter.    Maia Plan [facility] Pre-Surgical Testing [phone] 08/08/18 10:13 AM

## 2018-08-13 NOTE — Progress Notes (Signed)
SPOKE W/ patient via phone.      SCREENING SYMPTOMS OF COVID 19:     COUGH--no   RUNNY NOSE---no    SORE THROAT---no   NASAL CONGESTION----no   SNEEZING---no   SHORTNESS OF BREATH---no   DIFFICULTY BREATHING---no   TEMP >100.0 -----no   UNEXPLAINED BODY ACHES------no   CHILLS --------no    HEADACHES ---------no   LOSS OF SMELL/ TASTE --------no       HAVE YOU OR ANY FAMILY MEMBER TRAVELLED PAST 14 DAYS OUT OF THE    COUNTY---no STATE----no COUNTRY----no   HAVE YOU OR ANY FAMILY MEMBER BEEN EXPOSED TO ANYONE WITH COVID 19?    no 

## 2018-08-14 ENCOUNTER — Encounter (HOSPITAL_COMMUNITY)
Admission: RE | Disposition: A | Discharge status: Home / Self Care | Payer: Self-pay | Source: Home / Self Care | Attending: Urology

## 2018-08-14 ENCOUNTER — Ambulatory Visit (HOSPITAL_COMMUNITY): Payer: Medicare Other | Admitting: Anesthesiology

## 2018-08-14 ENCOUNTER — Observation Stay (HOSPITAL_COMMUNITY)
Admission: RE | Admit: 2018-08-14 | Discharge: 2018-08-15 | Disposition: A | Discharge status: Home / Self Care | Payer: Medicare Other | Attending: Urology | Admitting: Urology

## 2018-08-14 ENCOUNTER — Other Ambulatory Visit: Payer: Self-pay

## 2018-08-14 ENCOUNTER — Ambulatory Visit (HOSPITAL_COMMUNITY): Payer: Medicare Other | Admitting: Physician Assistant

## 2018-08-14 ENCOUNTER — Encounter (HOSPITAL_COMMUNITY): Payer: Self-pay | Admitting: *Deleted

## 2018-08-14 DIAGNOSIS — Z8042 Family history of malignant neoplasm of prostate: Secondary | ICD-10-CM | POA: Diagnosis not present

## 2018-08-14 DIAGNOSIS — C61 Malignant neoplasm of prostate: Principal | ICD-10-CM | POA: Insufficient documentation

## 2018-08-14 DIAGNOSIS — Z800 Family history of malignant neoplasm of digestive organs: Secondary | ICD-10-CM | POA: Insufficient documentation

## 2018-08-14 DIAGNOSIS — Z801 Family history of malignant neoplasm of trachea, bronchus and lung: Secondary | ICD-10-CM | POA: Diagnosis not present

## 2018-08-14 DIAGNOSIS — Z87891 Personal history of nicotine dependence: Secondary | ICD-10-CM | POA: Insufficient documentation

## 2018-08-14 DIAGNOSIS — Z7289 Other problems related to lifestyle: Secondary | ICD-10-CM | POA: Diagnosis not present

## 2018-08-14 DIAGNOSIS — I10 Essential (primary) hypertension: Secondary | ICD-10-CM | POA: Insufficient documentation

## 2018-08-14 HISTORY — PX: ROBOT ASSISTED LAPAROSCOPIC RADICAL PROSTATECTOMY: SHX5141

## 2018-08-14 HISTORY — PX: LYMPHADENECTOMY: SHX5960

## 2018-08-14 LAB — TYPE AND SCREEN
ABO/RH(D): A POS
Antibody Screen: NEGATIVE

## 2018-08-14 LAB — ABO/RH: ABO/RH(D): A POS

## 2018-08-14 LAB — HEMOGLOBIN AND HEMATOCRIT, BLOOD
HCT: 42.8 % (ref 39.0–52.0)
Hemoglobin: 13.5 g/dL (ref 13.0–17.0)

## 2018-08-14 SURGERY — PROSTATECTOMY, RADICAL, ROBOT-ASSISTED, LAPAROSCOPIC
Anesthesia: General

## 2018-08-14 MED ORDER — BELLADONNA ALKALOIDS-OPIUM 16.2-60 MG RE SUPP: 1.0000 | Freq: Four times a day (QID) | RECTAL | Status: DC | PRN

## 2018-08-14 MED ORDER — LISINOPRIL 20 MG PO TABS
40.0000 mg | ORAL_TABLET | Freq: Every day | ORAL | Status: DC
  Administered 2018-08-14: 40 mg via ORAL
  Filled 2018-08-14: qty 2

## 2018-08-14 MED ORDER — MIDAZOLAM HCL 2 MG/2ML IJ SOLN
INTRAMUSCULAR | Status: AC
  Filled 2018-08-14: qty 2

## 2018-08-14 MED ORDER — PROPOFOL 10 MG/ML IV BOLUS
INTRAVENOUS | Status: AC
  Filled 2018-08-14: qty 20

## 2018-08-14 MED ORDER — PHENOL 1.4 % MT LIQD
1.0000 | OROMUCOSAL | Status: DC | PRN
  Filled 2018-08-14: qty 177

## 2018-08-14 MED ORDER — SODIUM CHLORIDE (PF) 0.9 % IJ SOLN
INTRAMUSCULAR | Status: AC
  Filled 2018-08-14: qty 10

## 2018-08-14 MED ORDER — BACITRACIN-NEOMYCIN-POLYMYXIN 400-5-5000 EX OINT: 1.0000 "application " | TOPICAL_OINTMENT | Freq: Three times a day (TID) | CUTANEOUS | Status: DC | PRN

## 2018-08-14 MED ORDER — SODIUM CHLORIDE (PF) 0.9 % IJ SOLN
INTRAMUSCULAR | Status: DC | PRN
  Administered 2018-08-14: 20 mL

## 2018-08-14 MED ORDER — LIDOCAINE 2% (20 MG/ML) 5 ML SYRINGE
INTRAMUSCULAR | Status: DC | PRN
  Administered 2018-08-14: 100 mg via INTRAVENOUS

## 2018-08-14 MED ORDER — LACTATED RINGERS IV SOLN
INTRAVENOUS | Status: DC
  Administered 2018-08-14 (×2): via INTRAVENOUS

## 2018-08-14 MED ORDER — ONDANSETRON HCL 4 MG/2ML IJ SOLN: 4.0000 mg | INTRAMUSCULAR | Status: DC | PRN

## 2018-08-14 MED ORDER — SUCCINYLCHOLINE CHLORIDE 200 MG/10ML IV SOSY
PREFILLED_SYRINGE | INTRAVENOUS | Status: DC | PRN
  Administered 2018-08-14: 120 mg via INTRAVENOUS

## 2018-08-14 MED ORDER — DIPHENHYDRAMINE HCL 12.5 MG/5ML PO ELIX: 12.5000 mg | ORAL_SOLUTION | Freq: Four times a day (QID) | ORAL | Status: DC | PRN

## 2018-08-14 MED ORDER — OXYCODONE HCL 5 MG PO TABS: 5.0000 mg | ORAL_TABLET | Freq: Once | ORAL | Status: DC | PRN

## 2018-08-14 MED ORDER — BUPIVACAINE LIPOSOME 1.3 % IJ SUSP
20.0000 mL | Freq: Once | INTRAMUSCULAR | Status: AC
  Administered 2018-08-14: 15:00:00 20 mL
  Filled 2018-08-14 (×2): qty 20

## 2018-08-14 MED ORDER — SUGAMMADEX SODIUM 200 MG/2ML IV SOLN
INTRAVENOUS | Status: DC | PRN
  Administered 2018-08-14: 200 mg via INTRAVENOUS

## 2018-08-14 MED ORDER — MEPERIDINE HCL 50 MG/ML IJ SOLN: 6.2500 mg | INTRAMUSCULAR | Status: DC | PRN

## 2018-08-14 MED ORDER — DIPHENHYDRAMINE HCL 50 MG/ML IJ SOLN: 12.5000 mg | Freq: Four times a day (QID) | INTRAMUSCULAR | Status: DC | PRN

## 2018-08-14 MED ORDER — FENTANYL CITRATE (PF) 100 MCG/2ML IJ SOLN
INTRAMUSCULAR | Status: DC | PRN
  Administered 2018-08-14 (×2): 50 ug via INTRAVENOUS

## 2018-08-14 MED ORDER — INDOCYANINE GREEN 25 MG IV SOLR
INTRAVENOUS | Status: DC | PRN
  Administered 2018-08-14: 13:00:00 25 mg via OPHTHALMIC

## 2018-08-14 MED ORDER — DEXAMETHASONE SODIUM PHOSPHATE 10 MG/ML IJ SOLN
INTRAMUSCULAR | Status: AC
  Filled 2018-08-14: qty 1

## 2018-08-14 MED ORDER — ROCURONIUM BROMIDE 10 MG/ML (PF) SYRINGE
PREFILLED_SYRINGE | INTRAVENOUS | Status: AC
  Filled 2018-08-14: qty 10

## 2018-08-14 MED ORDER — DEXAMETHASONE SODIUM PHOSPHATE 10 MG/ML IJ SOLN
INTRAMUSCULAR | Status: DC | PRN
  Administered 2018-08-14: 10 mg via INTRAVENOUS

## 2018-08-14 MED ORDER — SODIUM CHLORIDE 0.9 % IV BOLUS
1000.0000 mL | Freq: Once | INTRAVENOUS | Status: AC
  Administered 2018-08-14: 16:00:00 1000 mL via INTRAVENOUS

## 2018-08-14 MED ORDER — CEFAZOLIN SODIUM-DEXTROSE 2-4 GM/100ML-% IV SOLN
2.0000 g | INTRAVENOUS | Status: AC
  Administered 2018-08-14: 2 g via INTRAVENOUS
  Filled 2018-08-14: qty 100

## 2018-08-14 MED ORDER — STERILE WATER FOR IRRIGATION IR SOLN
Status: DC | PRN
  Administered 2018-08-14: 1000 mL

## 2018-08-14 MED ORDER — SULFAMETHOXAZOLE-TRIMETHOPRIM 800-160 MG PO TABS: 1.0000 | ORAL_TABLET | Freq: Two times a day (BID) | ORAL | 0 refills | Status: DC

## 2018-08-14 MED ORDER — LIDOCAINE 2% (20 MG/ML) 5 ML SYRINGE
INTRAMUSCULAR | Status: AC
  Filled 2018-08-14: qty 5

## 2018-08-14 MED ORDER — OXYCODONE HCL 5 MG PO TABS
5.0000 mg | ORAL_TABLET | ORAL | Status: DC | PRN
  Administered 2018-08-14: 22:00:00 5 mg via ORAL
  Filled 2018-08-14: qty 1

## 2018-08-14 MED ORDER — HYDROCODONE-ACETAMINOPHEN 5-325 MG PO TABS: 1.0000 | ORAL_TABLET | Freq: Four times a day (QID) | ORAL | 0 refills | Status: DC | PRN

## 2018-08-14 MED ORDER — FENTANYL CITRATE (PF) 100 MCG/2ML IJ SOLN
INTRAMUSCULAR | Status: AC
  Filled 2018-08-14: qty 2

## 2018-08-14 MED ORDER — LACTATED RINGERS IV SOLN
INTRAVENOUS | Status: AC
  Administered 2018-08-14 – 2018-08-15 (×2): via INTRAVENOUS

## 2018-08-14 MED ORDER — MENTHOL 3 MG MT LOZG
1.0000 | LOZENGE | OROMUCOSAL | Status: DC | PRN
  Filled 2018-08-14: qty 9

## 2018-08-14 MED ORDER — ACETAMINOPHEN 500 MG PO TABS
1000.0000 mg | ORAL_TABLET | Freq: Four times a day (QID) | ORAL | Status: DC
  Administered 2018-08-15 (×3): 1000 mg via ORAL
  Filled 2018-08-14 (×3): qty 2

## 2018-08-14 MED ORDER — PROMETHAZINE HCL 25 MG/ML IJ SOLN: 6.2500 mg | INTRAMUSCULAR | Status: DC | PRN

## 2018-08-14 MED ORDER — EPHEDRINE 5 MG/ML INJ
INTRAVENOUS | Status: AC
  Filled 2018-08-14: qty 10

## 2018-08-14 MED ORDER — HYDROMORPHONE HCL 1 MG/ML IJ SOLN: 0.2500 mg | INTRAMUSCULAR | Status: DC | PRN

## 2018-08-14 MED ORDER — ORAL CARE MOUTH RINSE
15.0000 mL | Freq: Two times a day (BID) | OROMUCOSAL | Status: DC
  Administered 2018-08-15: 15 mL via OROMUCOSAL

## 2018-08-14 MED ORDER — ROCURONIUM BROMIDE 10 MG/ML (PF) SYRINGE
PREFILLED_SYRINGE | INTRAVENOUS | Status: DC | PRN
  Administered 2018-08-14: 20 mg via INTRAVENOUS
  Administered 2018-08-14: 10 mg via INTRAVENOUS
  Administered 2018-08-14: 50 mg via INTRAVENOUS
  Administered 2018-08-14 (×2): 10 mg via INTRAVENOUS

## 2018-08-14 MED ORDER — EPHEDRINE SULFATE-NACL 50-0.9 MG/10ML-% IV SOSY
PREFILLED_SYRINGE | INTRAVENOUS | Status: DC | PRN
  Administered 2018-08-14: 10 mg via INTRAVENOUS

## 2018-08-14 MED ORDER — ONDANSETRON HCL 4 MG/2ML IJ SOLN
INTRAMUSCULAR | Status: DC | PRN
  Administered 2018-08-14: 4 mg via INTRAVENOUS

## 2018-08-14 MED ORDER — DOCUSATE SODIUM 100 MG PO CAPS
100.0000 mg | ORAL_CAPSULE | Freq: Two times a day (BID) | ORAL | Status: DC
  Administered 2018-08-14 – 2018-08-15 (×2): 100 mg via ORAL
  Filled 2018-08-14 (×2): qty 1

## 2018-08-14 MED ORDER — SUGAMMADEX SODIUM 200 MG/2ML IV SOLN
INTRAVENOUS | Status: AC
  Filled 2018-08-14: qty 2

## 2018-08-14 MED ORDER — HYDROMORPHONE HCL 1 MG/ML IJ SOLN: 0.5000 mg | INTRAMUSCULAR | Status: DC | PRN

## 2018-08-14 MED ORDER — OXYCODONE HCL 5 MG/5ML PO SOLN: 5.0000 mg | Freq: Once | ORAL | Status: DC | PRN

## 2018-08-14 MED ORDER — SUFENTANIL CITRATE 50 MCG/ML IV SOLN
INTRAVENOUS | Status: DC | PRN
  Administered 2018-08-14 (×2): 10 ug via INTRAVENOUS
  Administered 2018-08-14: 20 ug via INTRAVENOUS
  Administered 2018-08-14: 10 ug via INTRAVENOUS

## 2018-08-14 MED ORDER — PROPOFOL 10 MG/ML IV BOLUS
INTRAVENOUS | Status: DC | PRN
  Administered 2018-08-14: 160 mg via INTRAVENOUS

## 2018-08-14 MED ORDER — LACTATED RINGERS IR SOLN
Status: DC | PRN
  Administered 2018-08-14: 1000 mL

## 2018-08-14 MED ORDER — MIDAZOLAM HCL 2 MG/2ML IJ SOLN
INTRAMUSCULAR | Status: DC | PRN
  Administered 2018-08-14: 2 mg via INTRAVENOUS

## 2018-08-14 MED ORDER — SUCCINYLCHOLINE CHLORIDE 200 MG/10ML IV SOSY
PREFILLED_SYRINGE | INTRAVENOUS | Status: AC
  Filled 2018-08-14: qty 10

## 2018-08-14 MED ORDER — SUFENTANIL CITRATE 50 MCG/ML IV SOLN
INTRAVENOUS | Status: AC
  Filled 2018-08-14: qty 1

## 2018-08-14 MED ORDER — AMLODIPINE BESYLATE 5 MG PO TABS
5.0000 mg | ORAL_TABLET | Freq: Every day | ORAL | Status: DC
  Administered 2018-08-14: 5 mg via ORAL
  Filled 2018-08-14: qty 1

## 2018-08-14 SURGICAL SUPPLY — 66 items
APPLICATOR COTTON TIP 6 STRL (MISCELLANEOUS) ×2 IMPLANT
APPLICATOR COTTON TIP 6IN STRL (MISCELLANEOUS) ×4
CATH FOLEY 2WAY SLVR 18FR 30CC (CATHETERS) ×4 IMPLANT
CATH TIEMANN FOLEY 18FR 5CC (CATHETERS) ×4 IMPLANT
CHLORAPREP W/TINT 26 (MISCELLANEOUS) ×4 IMPLANT
CLIP VESOLOCK LG 6/CT PURPLE (CLIP) ×8 IMPLANT
CONT SPEC 4OZ CLIKSEAL STRL BL (MISCELLANEOUS) ×4 IMPLANT
COVER SURGICAL LIGHT HANDLE (MISCELLANEOUS) ×4 IMPLANT
COVER TIP SHEARS 8 DVNC (MISCELLANEOUS) ×4 IMPLANT
COVER TIP SHEARS 8MM DA VINCI (MISCELLANEOUS) ×4
COVER WAND RF STERILE (DRAPES) IMPLANT
CUTTER ECHEON FLEX ENDO 45 340 (ENDOMECHANICALS) ×4 IMPLANT
DECANTER SPIKE VIAL GLASS SM (MISCELLANEOUS) ×4 IMPLANT
DERMABOND ADVANCED (GAUZE/BANDAGES/DRESSINGS) ×2
DERMABOND ADVANCED .7 DNX12 (GAUZE/BANDAGES/DRESSINGS) ×2 IMPLANT
DRAPE ARM DVNC X/XI (DISPOSABLE) ×8 IMPLANT
DRAPE COLUMN DVNC XI (DISPOSABLE) ×2 IMPLANT
DRAPE DA VINCI XI ARM (DISPOSABLE) ×8
DRAPE DA VINCI XI COLUMN (DISPOSABLE) ×2
DRAPE SURG IRRIG POUCH 19X23 (DRAPES) ×4 IMPLANT
DRSG TEGADERM 2-3/8X2-3/4 SM (GAUZE/BANDAGES/DRESSINGS) ×4 IMPLANT
DRSG TEGADERM 4X4.75 (GAUZE/BANDAGES/DRESSINGS) ×4 IMPLANT
ELECT PENCIL ROCKER SW 15FT (MISCELLANEOUS) ×4 IMPLANT
ELECT REM PT RETURN 15FT ADLT (MISCELLANEOUS) ×4 IMPLANT
GAUZE SPONGE 2X2 8PLY STRL LF (GAUZE/BANDAGES/DRESSINGS) ×2 IMPLANT
GLOVE BIO SURGEON STRL SZ 6.5 (GLOVE) ×3 IMPLANT
GLOVE BIO SURGEONS STRL SZ 6.5 (GLOVE) ×1
GLOVE BIOGEL M STRL SZ7.5 (GLOVE) ×8 IMPLANT
GLOVE BIOGEL PI IND STRL 7.5 (GLOVE) ×2 IMPLANT
GLOVE BIOGEL PI INDICATOR 7.5 (GLOVE) ×2
GOWN STRL REUS W/TWL LRG LVL3 (GOWN DISPOSABLE) ×12 IMPLANT
HOLDER FOLEY CATH W/STRAP (MISCELLANEOUS) ×4 IMPLANT
IRRIG SUCT STRYKERFLOW 2 WTIP (MISCELLANEOUS) ×4
IRRIGATION SUCT STRKRFLW 2 WTP (MISCELLANEOUS) ×2 IMPLANT
IV LACTATED RINGERS 1000ML (IV SOLUTION) ×4 IMPLANT
KIT PROCEDURE DA VINCI SI (MISCELLANEOUS) ×2
KIT PROCEDURE DVNC SI (MISCELLANEOUS) ×2 IMPLANT
KIT TURNOVER KIT A (KITS) IMPLANT
NEEDLE INSUFFLATION 14GA 120MM (NEEDLE) ×4 IMPLANT
NEEDLE SPNL 22GX7 QUINCKE BK (NEEDLE) ×4 IMPLANT
PACK ROBOT UROLOGY CUSTOM (CUSTOM PROCEDURE TRAY) ×4 IMPLANT
PAD POSITIONING PINK XL (MISCELLANEOUS) ×4 IMPLANT
PORT ACCESS TROCAR AIRSEAL 12 (TROCAR) ×2 IMPLANT
PORT ACCESS TROCAR AIRSEAL 5M (TROCAR) ×2
SEAL CANN UNIV 5-8 DVNC XI (MISCELLANEOUS) ×8 IMPLANT
SEAL XI 5MM-8MM UNIVERSAL (MISCELLANEOUS) ×8
SET TRI-LUMEN FLTR TB AIRSEAL (TUBING) ×4 IMPLANT
SOLUTION ELECTROLUBE (MISCELLANEOUS) ×4 IMPLANT
SPONGE GAUZE 2X2 STER 10/PKG (GAUZE/BANDAGES/DRESSINGS) ×2
SPONGE LAP 4X18 RFD (DISPOSABLE) ×4 IMPLANT
STAPLE RELOAD 45 GRN (STAPLE) ×2 IMPLANT
STAPLE RELOAD 45MM GREEN (STAPLE) ×2
SUT ETHILON 3 0 PS 1 (SUTURE) ×4 IMPLANT
SUT MNCRL AB 4-0 PS2 18 (SUTURE) ×8 IMPLANT
SUT NOVA NAB GS-21 0 18 T12 DT (SUTURE) ×4 IMPLANT
SUT PDS AB 1 CT1 27 (SUTURE) ×8 IMPLANT
SUT VIC AB 0 CT1 27 (SUTURE) ×4
SUT VIC AB 0 CT1 27XBRD ANTBC (SUTURE) ×4 IMPLANT
SUT VIC AB 2-0 SH 27 (SUTURE) ×2
SUT VIC AB 2-0 SH 27X BRD (SUTURE) ×2 IMPLANT
SUT VICRYL 0 UR6 27IN ABS (SUTURE) ×4 IMPLANT
SUT VLOC BARB 180 ABS3/0GR12 (SUTURE) ×12
SUTURE VLOC BRB 180 ABS3/0GR12 (SUTURE) ×6 IMPLANT
SYR 27GX1/2 1ML LL SAFETY (SYRINGE) ×4 IMPLANT
TOWEL OR NON WOVEN STRL DISP B (DISPOSABLE) ×4 IMPLANT
WATER STERILE IRR 1000ML POUR (IV SOLUTION) ×4 IMPLANT

## 2018-08-14 NOTE — Op Note (Signed)
NAMEJAYVION, STEFANSKI MEDICAL RECORD [id number] ACCOUNT [id number] DATE OF BIRTH:10-Jan-1951 FACILITY: [facility] LOCATION: [facility] PHYSICIAN:Nadezhda Pollitt, MD  OPERATIVE REPORT  DATE OF PROCEDURE:  08/14/2018  PREOPERATIVE DIAGNOSIS:  Prostate cancer.  PROCEDURE: 1.  Robotic-assisted laparoscopic radical prostatectomy. 2.  Bilateral pelvic lymphadenectomy. 3.  Injection of an indocyanine green dye for sentinel lymph angiography.  ESTIMATED BLOOD LOSS:  100 mL.  COMPLICATIONS:  None.  SPECIMENS: 1.  Periprostatic fat. 2.  Radical prostatectomy. 3.  Right external iliac lymph nodes. 4.  Right obturator lymph nodes. 5.  Left external iliac lymph nodes. 6.  Left obturator lymph nodes.  ASSISTANT:  Bari Mantis, PA  DRAINS: 1.  Jackson-Pratt drain bulb suction. 2.  Foley catheter.  FINDINGS: 1.  No significant adhesions in the area of prior umbilical hernia repair with mesh. 2.  No obvious sentinel lymph nodes on sentinel lymph angiography.  INDICATIONS:  The patient is a pleasant and quite vigorous 68 year old gentleman who was found on workup to have elevated PSA and to have multifocal fairly large volume adenocarcinoma of the prostate.  He also has some mild obstructive urinary symptoms.   Options were discussed for management including surveillance protocols versus ablative therapy versus surgical extirpation and he wished proceed with radical prostatectomy with curative intent.  Informed consent was then placed in medical record.   Notably, does have history of umbilical hernia repair with mesh years ago.  DESCRIPTION OF PROCEDURE:  The patient being Isreal Moline, procedure being radical prostatectomy was confirmed.  Procedure timeout was performed.  Intravenous administered.  General endotracheal anesthesia induced.  The patient was placed into a low  lithotomy position.  Sterile field was created prepping and draping base of the penis, perineum and proximal thighs using  iodine and his infra-xiphoid abdomen using chlorhexidine gluconate after clipper shaving and further fashioning the operative table  using 3-inch tape over foam padding.  His arms were at the side with padded with gel rolls.  A test using steep Trendelenburg positioning was performed and found to be suitably positioned.  Next, a high-flow, low-pressure pneumoperitoneum created a  high-flow, low-pressure pneumoperitoneum was obtained using Veress technique in the supraumbilical midline, well away from the area of prior umbilical hernia repair after passed the aspiration and drop test.  Next, an 8 mm robotic camera port was placed  into a location, left paramedian, approximately 1 handbreadth inferior lateral to this supraumbilical instillation site.  Laparoscopic examination of peritoneal cavity revealed no significant adhesions, no visceral injury.  The area of prior mesh was  visualized, inferior completely to the site of insufflation.  As such, additional ports were placed as follows:  Supraumbilical midline 8 mm robotic camera port in the previous site of insufflation, right paramedian 8 mm robotic port, right far lateral  12 mm AirSeal assist port, right paramedian 5 mm suction port, left far lateral 8 mm robotic port.  Robot was docked and passed the electronic checks.  Attention was directed at the space of Retzius.  Incision was made lateral to right medial umbilical  ligament from the midline towards the internal ring, coursing along the iliac vessels towards the area of the right ureter.  Right vas deferens was encountered, ligated using medial bucket handle on the right and bladder was swept away from the pelvic  sidewall towards the area of the endopelvic fascia on the right side.  A mirror image dissection was performed on the left side.  Anterior attachments were taken down using cautery scissors to expose  the anterior base of the prostate, which was defatted  to better demarcate the bladder  neck, prostate junction.  The fibrofatty tissues was set aside, labeled as periprosthetic fat.  Next, 0.2 mL of an indocyanine green dye was injected in each lobe of the prostate using a percutaneously placed robotically  guided spinal needle with intervening suctioning to prevent dye spillage, which did not occur.  Next, the endopelvic fascia was carefully swept away from the lateral side of the prostate and base to apex orientation to expose the dorsal venous complex  which was controlled using the vascular stapler, taking exquisite care to avoid membranous urethral injury which did not occur.  Then, approximately 10 minutes post-dye injection, the pelvis was inspected under near infrared fluorescence light.  Sentinel lymphangiography revealed excellent parenchymal uptake of the prostate with the ICG dye; however, there were no obvious sentinel lymph nodes noted within the pelvic lymph node fields.  As such template dissection was performed, first, in the  right external iliac group with boundaries being right external iliac artery, vein, pelvic sidewall, iliac bifurcation.  Lymphostasis achieved with cold clips.  This tissue was set aside, labeled right external iliac lymph nodes.  Next the right  obturator group was dissected free with the boundaries being right external iliac vein, pelvic sidewall, obturator nerve.  Lymphostasis achieved with cold clips, set aside and labeled right obturator lymph nodes.  The right obturator nerve was inspected  following maneuvers and found to be uninjured.  A mirror image lymphadenectomy was performed in the left external iliac, left obturator group, respectively and left obturator nerve was similarly inspected and found to be uninjured.  Attention was  directed at bladder neck dissection.  Bladder neck was identified in the anterior plane by moving the Foley catheter back and forth and a lateral release was performed to better demarcate the bladder neck, prostate  junction, which was then separated in  an anterior, posterior direction, keeping what appeared to be a rim of circular muscle fibers in each plane of dissection.  There was some concern based on preoperative ultrasound of median lobe.  This was very carefully inspected and found to be  minimal.  This did not require extensive maneuvers.  Posterior dissection was performed by incising approximately 7 mm inferior posterior the portion of the prostate entering the plane of Denonvilliers.  Bilateral vas deferens were encountered, dissected  for a distance of approximately 4 cm, ligated and placed on gentle superior traction.  Bilateral seminal vesicles were dissected to their tip and placed on gentle superior traction.  Dissection then proceeded towards the apex of the prostate within the  plane Denonvilliers.  This exposed the vascular pedicles on each side which was controlled using a sequential clipping technique in a to apex orientation and moderate nerve sparing was performed at the apical area.  Final apical dissection was performed  in the anterior plane by transecting the membranous urethra coldly, keeping what appeared to be an adequate membranous urethral stump.  This completely freed the prostatectomy specimen which was placed in EndoCatch bag for later retrieval.  Next, digital  rectal exam was performed using indicator glove and laparoscopic vision.  No evidence of rectal violation was noted.  Posterior dissection was performed using 3-0 V-Loc suture reapproximating the posterior urethral plate, the posterior bladder neck,  bringing the structures into tension free apposition.  Next mucosa-to-mucosa anastomosis was performed between the urethral stump and the bladder neck from the 6 o'clock to 12 o'clock position  which resulted in excellent tension free mucosal apposition.   A Foley catheter was then placed, which irrigated quantitatively.  All sponge, needle counts were correct.  Hemostasis  appeared excellent.  A closed suction drain was brought through the previous left lateral most robotic port site through the  peritoneal cavity.  Robot was then undocked.  The previous right lateral most 12 mm assistant port site was closed in the fascia using Carter-Thomason suture passer and 0 Vicryl.  Specimen was retrieved by extending the previous camera port site  superiorly for a distance of approximately 4 cm, removing the prostatectomy specimen setting aside from pathology.  This was purposely positioned in the area away from his prior known mesh.  The fascia was closed using figure-of-eight Novafil x3 followed  by reapproximation of Scarpa's with a running Vicryl.  All incision sites were infiltrated with dilute lipolyzed Marcaine and closed at the level of the skin using subcuticular Monocryl and Dermabond.  Procedure terminated.    The patient tolerated the procedure well.  No immediate complications.  The patient was taken to [facility] in stable condition.  Please note, first assistant, Bari Mantis, was crucial for all portions of the surgery today.  She provided invaluable retraction, suctioning, vascular clipping, lymphatic clipping vascular stapling and general first assistance.  AN/NUANCE  D:08/14/2018 T:08/14/2018 JOB:[id number]

## 2018-08-14 NOTE — Discharge Instructions (Signed)

## 2018-08-14 NOTE — Anesthesia Postprocedure Evaluation (Signed)
Anesthesia Post Note  Patient: Juan Trevino  Procedure(s) Performed: XI ROBOTIC ASSISTED LAPAROSCOPIC RADICAL PROSTATECTOMY (N/A ) LYMPHADENECTOMY (Bilateral )     Patient location during evaluation: PACU Anesthesia Type: General Level of consciousness: sedated and patient cooperative Pain management: pain level controlled Vital Signs Assessment: post-procedure vital signs reviewed and stable Respiratory status: spontaneous breathing Cardiovascular status: stable Anesthetic complications: no    Last Vitals:  Vitals:   08/14/18 1630 08/14/18 1645  BP: (!) 150/71 (!) 147/77  Pulse: 79 85  Resp: 12 15  Temp:    SpO2: 98% 97%    Last Pain:  Vitals:   08/14/18 1645  TempSrc:   PainSc: 0-No pain                 Nolon Nations

## 2018-08-14 NOTE — Transfer of Care (Signed)
Immediate Anesthesia Transfer of Care Note  Patient: Juan Trevino  Procedure(s) Performed: XI ROBOTIC ASSISTED LAPAROSCOPIC RADICAL PROSTATECTOMY (N/A ) LYMPHADENECTOMY (Bilateral )  Patient Location: PACU  Anesthesia Type:General  Level of Consciousness: awake, alert , oriented and patient cooperative  Airway & Oxygen Therapy: Patient Spontanous Breathing and Patient connected to face mask oxygen  Post-op Assessment: Report given to RN, Post -op Vital signs reviewed and stable and Patient moving all extremities  Post vital signs: Reviewed and stable  Last Vitals:  Vitals Value Taken Time  BP 136/86 08/14/2018  3:56 PM  Temp    Pulse    Resp 13 08/14/2018  3:58 PM  SpO2    Vitals shown include unvalidated device data.  Last Pain:  Vitals:   08/14/18 1000  TempSrc: Oral      Patients Stated Pain Goal: 2 (81/10/31 5945)  Complications: No apparent anesthesia complications

## 2018-08-14 NOTE — H&P (Signed)
Juan Trevino is an 68 y.o. male.    Chief Complaint: Pre-OP Prostatectomy  HPI:   1 - Moderate Risk Prostate Cancer - 6/12 cores up to 90% Grade 1 and Grade 2 cancer by BX 2020 on eval PSA 4.2 by [name]. Bilateral lateral and apical positivity. TRUX 59mL with some early median lobe.   PMH sig for HTN, open umbilical hernia repair with mesh (1980s, small incision). NO ischemic CV disease / blood thinners. His PCP is Suzi Roots MD.   Today " Juan Trevino " is seen to proceed with prostatectomy with node dissection.    Past Medical History:  Diagnosis Date  . Diverticulitis 2005  . GERD (gastroesophageal reflux disease)   . Hyperlipidemia   . Hypertension   . Prostate cancer ([place])   . Thyroid disease     Past Surgical History:  Procedure Laterality Date  . colonoscopy     . HERNIA REPAIR     umbilical  . hydrocelectomy     . PROSTATE BIOPSY    . UPPER GI ENDOSCOPY    . vasectomy      Family History  Problem Relation Age of Onset  . Colon cancer Mother   . Hypertension Mother   . Prostate cancer Father   . Lung cancer Father   . Hypertension Father   . Breast cancer Neg Hx   . Pancreatic cancer Neg Hx    Social History:  reports that he quit smoking about 32 years ago. His smoking use included cigarettes. He has a 27.00 pack-year smoking history. He has never used smokeless tobacco. He reports current alcohol use of about 8.0 standard drinks of alcohol per week. He reports that he does not use drugs.  Allergies: No Known Allergies  No medications prior to admission.    No results found for this or any previous visit (from the past 48 hour(s)). No results found.  Review of Systems  Constitutional: Negative.  Negative for chills and fever.    There were no vitals taken for this visit. Physical Exam  Constitutional: He appears well-developed.  HENT:  Head: Normocephalic.  Eyes: Pupils are equal, round, and reactive to light.  Neck: Normal range of motion.   Cardiovascular: Normal rate.  Respiratory: Effort normal.  GI: Soft.  Stable periumbilical scar w/o hernia recurrence.   Genitourinary:    Genitourinary Comments: No CVAT   Musculoskeletal: Normal range of motion.  Neurological: He is alert.  Skin: Skin is warm.  Psychiatric: He has a normal mood and affect.     Assessment/Plan  Proceed as planned with prostatectomy / node dissection for large volume prostate cancer. Risks, benefits, alternatives, expected peri-op course discussed previously and reiterated today.  In response to the COVID-19 crises affecting the [place], the [place] issued a request that all hospitals and ambulatory surgery centers suspend all non-urgent surgery.  In keeping with this guidance this patient's surgery has NOT been postponed as the patient and I agree that significant harm could result from delay including progression of cancer.  We specifically discussed that there is some risk of COVID-19 transmission in the peri-operative setting and that extra precautions are being taken to minimize this but there no guarantees that transmission will not occur. I answered all the patient's questions to the best of my ability.   Alexis Frock, MD 08/14/2018, 6:50 AM

## 2018-08-14 NOTE — Anesthesia Preprocedure Evaluation (Signed)
Anesthesia Evaluation  Patient identified by MRN, date of birth, ID band Patient awake    Reviewed: Allergy & Precautions, NPO status , Patient's Chart, lab work & pertinent test results  Airway Mallampati: II  TM Distance: >3 FB Neck ROM: Full    Dental  (+) Dental Advisory Given, Teeth Intact   Pulmonary former smoker,    Pulmonary exam normal breath sounds clear to auscultation       Cardiovascular hypertension, Pt. on medications Normal cardiovascular exam Rhythm:Regular Rate:Normal     Neuro/Psych negative neurological ROS  negative psych ROS   GI/Hepatic Neg liver ROS, GERD  ,  Endo/Other  negative endocrine ROS  Renal/GU negative Renal ROS     Musculoskeletal negative musculoskeletal ROS (+)   Abdominal   Peds  Hematology negative hematology ROS (+)   Anesthesia Other Findings   Reproductive/Obstetrics                             Anesthesia Physical Anesthesia Plan  ASA: II  Anesthesia Plan: General   Post-op Pain Management:    Induction: Intravenous  PONV Risk Score and Plan: 4 or greater and Ondansetron, Dexamethasone, Treatment may vary due to age or medical condition and Metaclopromide  Airway Management Planned: Oral ETT  Additional Equipment: None  Intra-op Plan:   Post-operative Plan: Extubation in OR  Informed Consent: I have reviewed the patients History and Physical, chart, labs and discussed the procedure including the risks, benefits and alternatives for the proposed anesthesia with the patient or authorized representative who has indicated his/her understanding and acceptance.     Dental advisory given  Plan Discussed with: CRNA  Anesthesia Plan Comments:         Anesthesia Quick Evaluation

## 2018-08-14 NOTE — Anesthesia Procedure Notes (Signed)
Procedure Name: Intubation Date/Time: 08/14/2018 12:09 PM Performed by: Sharlette Dense, CRNA Patient Re-evaluated:Patient Re-evaluated prior to induction Oxygen Delivery Method: Circle system utilized Preoxygenation: Pre-oxygenation with 100% oxygen Induction Type: Inhalational induction Laryngoscope Size: Miller and 3 Grade View: Grade I Tube type: Oral Tube size: 8.0 mm Number of attempts: 1 Airway Equipment and Method: Stylet Placement Confirmation: ETT inserted through vocal cords under direct vision,  positive ETCO2 and breath sounds checked- equal and bilateral Secured at: 23 cm Tube secured with: Tape Dental Injury: Teeth and Oropharynx as per pre-operative assessment

## 2018-08-14 NOTE — Brief Op Note (Signed)
08/14/2018  3:50 PM  PATIENT:  Juan Trevino  68 y.o. male  PRE-OPERATIVE DIAGNOSIS:  PROSTATE CANCER  POST-OPERATIVE DIAGNOSIS:  PROSTATE CANCER  PROCEDURE:  Procedure(s) with comments: XI ROBOTIC ASSISTED LAPAROSCOPIC RADICAL PROSTATECTOMY (N/A) - 3 HRS LYMPHADENECTOMY (Bilateral)  SURGEON:  Surgeon(s) and Role:    Alexis Frock, MD - Primary  PHYSICIAN ASSISTANT:   ASSISTANTS: Clemetine Marker PA   ANESTHESIA:   local and general  EBL:  100 mL   BLOOD ADMINISTERED:none  DRAINS: 1 - JP to bulb; 2 - Foley to gravity   LOCAL MEDICATIONS USED:  MARCAINE     SPECIMEN:  Source of Specimen:  1 - periprostatic fat; 2 - pelvic lymph nodes; 3- prostatectomy  DISPOSITION OF SPECIMEN:  PATHOLOGY  COUNTS:  YES  TOURNIQUET:  * No tourniquets in log *  DICTATION: .Other Dictation: Dictation Number [id number]  PLAN OF CARE: Admit for overnight observation  PATIENT DISPOSITION:  PACU - hemodynamically stable.   Delay start of Pharmacological VTE agent (>24hrs) due to surgical blood loss or risk of bleeding: yes

## 2018-08-15 ENCOUNTER — Encounter (HOSPITAL_COMMUNITY): Payer: Self-pay | Admitting: Urology

## 2018-08-15 ENCOUNTER — Encounter: Payer: Self-pay | Admitting: Medical Oncology

## 2018-08-15 DIAGNOSIS — Z800 Family history of malignant neoplasm of digestive organs: Secondary | ICD-10-CM | POA: Diagnosis not present

## 2018-08-15 DIAGNOSIS — C61 Malignant neoplasm of prostate: Secondary | ICD-10-CM | POA: Diagnosis not present

## 2018-08-15 DIAGNOSIS — I10 Essential (primary) hypertension: Secondary | ICD-10-CM | POA: Diagnosis not present

## 2018-08-15 DIAGNOSIS — Z87891 Personal history of nicotine dependence: Secondary | ICD-10-CM | POA: Diagnosis not present

## 2018-08-15 DIAGNOSIS — Z8042 Family history of malignant neoplasm of prostate: Secondary | ICD-10-CM | POA: Diagnosis not present

## 2018-08-15 DIAGNOSIS — Z801 Family history of malignant neoplasm of trachea, bronchus and lung: Secondary | ICD-10-CM | POA: Diagnosis not present

## 2018-08-15 LAB — BASIC METABOLIC PANEL
Anion gap: 5 (ref 5–15)
BUN: 12 mg/dL (ref 8–23)
CO2: 26 mmol/L (ref 22–32)
Calcium: 8.9 mg/dL (ref 8.9–10.3)
Chloride: 107 mmol/L (ref 98–111)
Creatinine, Ser: 0.91 mg/dL (ref 0.61–1.24)
GFR calc Af Amer: >60 mL/min (ref >60)
GFR calc non Af Amer: >60 mL/min (ref >60)
Glucose, Bld: 141 mg/dL — ABNORMAL HIGH (ref 70–99)
Potassium: 5.3 mmol/L — ABNORMAL HIGH (ref 3.5–5.1)
Sodium: 138 mmol/L (ref 135–145)

## 2018-08-15 LAB — HEMOGLOBIN AND HEMATOCRIT, BLOOD
HCT: 40.6 % (ref 39.0–52.0)
Hemoglobin: 13.2 g/dL (ref 13.0–17.0)

## 2018-08-15 NOTE — Discharge Summary (Signed)
Physician Discharge Summary  Patient ID: Juan Trevino MRN: [id number] DOB/AGE: 16-Dec-1950 68 y.o.  Admit date: 08/14/2018 Discharge date: 08/15/2018  Admission Diagnoses: Prostate Cancer  Discharge Diagnoses:  Active Problems:   Prostate cancer [facility])   Discharged Condition: good  Hospital Course:  Pt underwent robotic prostatectomy on 08/14/18, the day of admission without acute complications. He was admitted to 4th floor urology service post-operatively. By the afternoon of POD 1 he is ambulatory, pain controlled on PO meds, maintaining, PO nutrition, and felt to be adequate for discharge. His JP was removed prior to discharge as output minimal. Hgb 10.9, Cr <1, path pending.   Consults: None  Significant Diagnostic Studies: labs: as per above  Treatments: surgery: as per above  Discharge Exam: Blood pressure 140/68, pulse 72, temperature 99.1 F (37.3 C), temperature source Oral, resp. rate 16, height 6' (1.829 m), weight 93 kg, SpO2 100 %. General appearance: alert, cooperative and appears stated age Eyes: negative Nose: Nares normal. Septum midline. Mucosa normal. No drainage or sinus tenderness. Throat: lips, mucosa, and tongue normal; teeth and gums normal Neck: supple, symmetrical, trachea midline Back: symmetric, no curvature. ROM normal. No CVA tenderness. Resp: non-labored on room air.  Cardio: Nl rate GI: soft, non-tender; bowel sounds normal; no masses,  no organomegaly Male genitalia: normal, foley in place wtih clear yellow urine.  Extremities: extremities normal, atraumatic, no cyanosis or edema Skin: Skin color, texture, turgor normal. No rashes or lesions Neurologic: Grossly normal Incision/Wound: Recent port sites / extraction sites c/d/i. JP removed and dry dressing placed.   Disposition:     Follow-up Information    Alexis Frock, MD On 08/26/2018.   Specialty:  Urology Why:  at 9:30 AM for MD visit and office catheter removal.  Contact information: [place] [address] [phone]           Signed: Alexis Frock 08/15/2018, 2:06 PM

## 2018-08-15 NOTE — Care Management Obs Status (Signed)
[facility] NOTIFICATION   Patient Details  Name: Tania Perrott MRN: [id number] Date of Birth: February 22, 1951   Medicare Observation Status Notification Given:  Yes    MahabirJuliann Pulse, RN 08/15/2018, 2:02 PM

## 2018-08-15 NOTE — Progress Notes (Signed)
Pt to be discharged to home this afternoon. Pt given discharge teaching including Medications and schedules. Prostatectomy Instructions for home reviewed with Pt. Pt able to return demonstration of foley to leg bag. Pt verbalized understanding of all discharge teaching and instructions, Discharge packet with Pt at time of discharge.

## 2018-09-10 DIAGNOSIS — M6281 Muscle weakness (generalized): Secondary | ICD-10-CM | POA: Diagnosis not present

## 2018-09-10 DIAGNOSIS — N393 Stress incontinence (female) (male): Secondary | ICD-10-CM | POA: Diagnosis not present

## 2018-09-10 DIAGNOSIS — R1031 Right lower quadrant pain: Secondary | ICD-10-CM | POA: Diagnosis not present

## 2018-09-10 DIAGNOSIS — M62838 Other muscle spasm: Secondary | ICD-10-CM | POA: Diagnosis not present

## 2018-09-17 DIAGNOSIS — M62838 Other muscle spasm: Secondary | ICD-10-CM | POA: Diagnosis not present

## 2018-09-17 DIAGNOSIS — M6281 Muscle weakness (generalized): Secondary | ICD-10-CM | POA: Diagnosis not present

## 2018-09-17 DIAGNOSIS — N393 Stress incontinence (female) (male): Secondary | ICD-10-CM | POA: Diagnosis not present

## 2018-09-17 DIAGNOSIS — R1031 Right lower quadrant pain: Secondary | ICD-10-CM | POA: Diagnosis not present

## 2018-09-20 DIAGNOSIS — M62838 Other muscle spasm: Secondary | ICD-10-CM | POA: Diagnosis not present

## 2018-09-20 DIAGNOSIS — M6281 Muscle weakness (generalized): Secondary | ICD-10-CM | POA: Diagnosis not present

## 2018-09-20 DIAGNOSIS — R1031 Right lower quadrant pain: Secondary | ICD-10-CM | POA: Diagnosis not present

## 2018-09-20 DIAGNOSIS — N393 Stress incontinence (female) (male): Secondary | ICD-10-CM | POA: Diagnosis not present

## 2018-09-26 DIAGNOSIS — M62838 Other muscle spasm: Secondary | ICD-10-CM | POA: Diagnosis not present

## 2018-09-26 DIAGNOSIS — M6281 Muscle weakness (generalized): Secondary | ICD-10-CM | POA: Diagnosis not present

## 2018-09-26 DIAGNOSIS — N393 Stress incontinence (female) (male): Secondary | ICD-10-CM | POA: Diagnosis not present

## 2018-10-09 DIAGNOSIS — M6281 Muscle weakness (generalized): Secondary | ICD-10-CM | POA: Diagnosis not present

## 2018-10-09 DIAGNOSIS — N393 Stress incontinence (female) (male): Secondary | ICD-10-CM | POA: Diagnosis not present

## 2018-10-09 DIAGNOSIS — R1031 Right lower quadrant pain: Secondary | ICD-10-CM | POA: Diagnosis not present

## 2018-10-09 DIAGNOSIS — M62838 Other muscle spasm: Secondary | ICD-10-CM | POA: Diagnosis not present

## 2018-10-17 DIAGNOSIS — Z86010 Personal history of colonic polyps: Secondary | ICD-10-CM | POA: Diagnosis not present

## 2018-10-17 DIAGNOSIS — K648 Other hemorrhoids: Secondary | ICD-10-CM | POA: Diagnosis not present

## 2018-10-17 DIAGNOSIS — K5730 Diverticulosis of large intestine without perforation or abscess without bleeding: Secondary | ICD-10-CM | POA: Diagnosis not present

## 2018-11-20 DIAGNOSIS — C61 Malignant neoplasm of prostate: Secondary | ICD-10-CM | POA: Diagnosis not present

## 2018-11-25 DIAGNOSIS — C61 Malignant neoplasm of prostate: Secondary | ICD-10-CM | POA: Diagnosis not present

## 2018-11-25 DIAGNOSIS — N393 Stress incontinence (female) (male): Secondary | ICD-10-CM | POA: Diagnosis not present

## 2018-11-25 DIAGNOSIS — N5231 Erectile dysfunction following radical prostatectomy: Secondary | ICD-10-CM | POA: Diagnosis not present

## 2018-12-04 DIAGNOSIS — Z23 Encounter for immunization: Secondary | ICD-10-CM | POA: Diagnosis not present

## 2019-01-09 DIAGNOSIS — K219 Gastro-esophageal reflux disease without esophagitis: Secondary | ICD-10-CM | POA: Diagnosis not present

## 2019-01-09 DIAGNOSIS — I700 Atherosclerosis of aorta: Secondary | ICD-10-CM | POA: Diagnosis not present

## 2019-01-09 DIAGNOSIS — E7800 Pure hypercholesterolemia, unspecified: Secondary | ICD-10-CM | POA: Diagnosis not present

## 2019-01-09 DIAGNOSIS — Z0000 Encounter for general adult medical examination without abnormal findings: Secondary | ICD-10-CM | POA: Diagnosis not present

## 2019-01-09 DIAGNOSIS — Z1211 Encounter for screening for malignant neoplasm of colon: Secondary | ICD-10-CM | POA: Diagnosis not present

## 2019-01-09 DIAGNOSIS — Z1389 Encounter for screening for other disorder: Secondary | ICD-10-CM | POA: Diagnosis not present

## 2019-01-09 DIAGNOSIS — I10 Essential (primary) hypertension: Secondary | ICD-10-CM | POA: Diagnosis not present

## 2019-01-09 DIAGNOSIS — M549 Dorsalgia, unspecified: Secondary | ICD-10-CM | POA: Diagnosis not present

## 2019-01-09 DIAGNOSIS — C61 Malignant neoplasm of prostate: Secondary | ICD-10-CM | POA: Diagnosis not present

## 2019-02-03 DIAGNOSIS — E7800 Pure hypercholesterolemia, unspecified: Secondary | ICD-10-CM | POA: Diagnosis not present

## 2019-05-01 ENCOUNTER — Ambulatory Visit: Payer: Medicare Other | Attending: Internal Medicine

## 2019-05-01 DIAGNOSIS — Z23 Encounter for immunization: Secondary | ICD-10-CM | POA: Insufficient documentation

## 2019-05-01 NOTE — Progress Notes (Signed)
   Covid-19 Vaccination Clinic  Name:  Juan Trevino    MRN: [id number] DOB: 1951/02/13  05/01/2019  Juan Trevino was observed post Covid-19 immunization for 15 minutes without incidence. He was provided with Vaccine Information Sheet and instruction to access the V-Safe system.   Juan Trevino was instructed to call 911 with any severe reactions post vaccine: Marland Kitchen Difficulty breathing  . Swelling of your face and throat  . A fast heartbeat  . A bad rash all over your body  . Dizziness and weakness    Immunizations Administered    Name Date Dose VIS Date Route   Pfizer COVID-19 Vaccine 05/01/2019  5:59 PM 0.3 mL 03/21/2019 Intramuscular   Manufacturer: [place]   Lot: [id number]   [place]: [id number]

## 2019-05-19 DIAGNOSIS — C61 Malignant neoplasm of prostate: Secondary | ICD-10-CM | POA: Diagnosis not present

## 2019-05-22 ENCOUNTER — Ambulatory Visit: Payer: Medicare Other | Attending: Internal Medicine

## 2019-05-22 DIAGNOSIS — Z23 Encounter for immunization: Secondary | ICD-10-CM | POA: Insufficient documentation

## 2019-05-22 NOTE — Progress Notes (Signed)
   Covid-19 Vaccination Clinic  Name:  Juan Trevino    MRN: [id number] DOB: 06-10-1950  05/22/2019  Juan Trevino was observed post Covid-19 immunization for 15 minutes without incidence. He was provided with Vaccine Information Sheet and instruction to access the V-Safe system.   Juan Trevino was instructed to call 911 with any severe reactions post vaccine: Marland Kitchen Difficulty breathing  . Swelling of your face and throat  . A fast heartbeat  . A bad rash all over your body  . Dizziness and weakness    Immunizations Administered    Name Date Dose VIS Date Route   Pfizer COVID-19 Vaccine 05/22/2019  5:24 PM 0.3 mL 03/21/2019 Intramuscular   Manufacturer: [place]   Lot: [id number]   [place]: [id number]

## 2019-05-26 DIAGNOSIS — C61 Malignant neoplasm of prostate: Secondary | ICD-10-CM | POA: Diagnosis not present

## 2019-05-26 DIAGNOSIS — N5231 Erectile dysfunction following radical prostatectomy: Secondary | ICD-10-CM | POA: Diagnosis not present

## 2019-05-26 DIAGNOSIS — N393 Stress incontinence (female) (male): Secondary | ICD-10-CM | POA: Diagnosis not present

## 2019-07-10 DIAGNOSIS — K219 Gastro-esophageal reflux disease without esophagitis: Secondary | ICD-10-CM | POA: Diagnosis not present

## 2019-07-10 DIAGNOSIS — C61 Malignant neoplasm of prostate: Secondary | ICD-10-CM | POA: Diagnosis not present

## 2019-07-10 DIAGNOSIS — M545 Low back pain: Secondary | ICD-10-CM | POA: Diagnosis not present

## 2019-07-10 DIAGNOSIS — I700 Atherosclerosis of aorta: Secondary | ICD-10-CM | POA: Diagnosis not present

## 2019-07-10 DIAGNOSIS — I10 Essential (primary) hypertension: Secondary | ICD-10-CM | POA: Diagnosis not present

## 2019-07-10 DIAGNOSIS — E7800 Pure hypercholesterolemia, unspecified: Secondary | ICD-10-CM | POA: Diagnosis not present

## 2019-11-16 DIAGNOSIS — Z20828 Contact with and (suspected) exposure to other viral communicable diseases: Secondary | ICD-10-CM | POA: Diagnosis not present

## 2019-12-04 DIAGNOSIS — Z23 Encounter for immunization: Secondary | ICD-10-CM | POA: Diagnosis not present

## 2019-12-05 DIAGNOSIS — Z20828 Contact with and (suspected) exposure to other viral communicable diseases: Secondary | ICD-10-CM | POA: Diagnosis not present

## 2020-01-05 DIAGNOSIS — Z23 Encounter for immunization: Secondary | ICD-10-CM | POA: Diagnosis not present

## 2020-01-13 DIAGNOSIS — I10 Essential (primary) hypertension: Secondary | ICD-10-CM | POA: Diagnosis not present

## 2020-01-13 DIAGNOSIS — E7800 Pure hypercholesterolemia, unspecified: Secondary | ICD-10-CM | POA: Diagnosis not present

## 2020-01-13 DIAGNOSIS — Z1389 Encounter for screening for other disorder: Secondary | ICD-10-CM | POA: Diagnosis not present

## 2020-01-13 DIAGNOSIS — L989 Disorder of the skin and subcutaneous tissue, unspecified: Secondary | ICD-10-CM | POA: Diagnosis not present

## 2020-01-13 DIAGNOSIS — Z1211 Encounter for screening for malignant neoplasm of colon: Secondary | ICD-10-CM | POA: Diagnosis not present

## 2020-01-13 DIAGNOSIS — C61 Malignant neoplasm of prostate: Secondary | ICD-10-CM | POA: Diagnosis not present

## 2020-01-13 DIAGNOSIS — K219 Gastro-esophageal reflux disease without esophagitis: Secondary | ICD-10-CM | POA: Diagnosis not present

## 2020-01-13 DIAGNOSIS — I700 Atherosclerosis of aorta: Secondary | ICD-10-CM | POA: Diagnosis not present

## 2020-01-13 DIAGNOSIS — M545 Low back pain, unspecified: Secondary | ICD-10-CM | POA: Diagnosis not present

## 2020-01-13 DIAGNOSIS — Z0000 Encounter for general adult medical examination without abnormal findings: Secondary | ICD-10-CM | POA: Diagnosis not present

## 2020-01-13 DIAGNOSIS — M25551 Pain in right hip: Secondary | ICD-10-CM | POA: Diagnosis not present

## 2020-01-22 DIAGNOSIS — N393 Stress incontinence (female) (male): Secondary | ICD-10-CM | POA: Diagnosis not present

## 2020-01-22 DIAGNOSIS — N5231 Erectile dysfunction following radical prostatectomy: Secondary | ICD-10-CM | POA: Diagnosis not present

## 2020-01-22 DIAGNOSIS — C61 Malignant neoplasm of prostate: Secondary | ICD-10-CM | POA: Diagnosis not present

## 2020-02-12 DIAGNOSIS — M25551 Pain in right hip: Secondary | ICD-10-CM | POA: Diagnosis not present

## 2020-02-12 DIAGNOSIS — M1611 Unilateral primary osteoarthritis, right hip: Secondary | ICD-10-CM | POA: Diagnosis not present

## 2020-02-12 DIAGNOSIS — M67911 Unspecified disorder of synovium and tendon, right shoulder: Secondary | ICD-10-CM | POA: Diagnosis not present

## 2020-02-17 DIAGNOSIS — M7541 Impingement syndrome of right shoulder: Secondary | ICD-10-CM | POA: Diagnosis not present

## 2020-02-17 DIAGNOSIS — M1611 Unilateral primary osteoarthritis, right hip: Secondary | ICD-10-CM | POA: Diagnosis not present

## 2020-02-17 DIAGNOSIS — M19011 Primary osteoarthritis, right shoulder: Secondary | ICD-10-CM | POA: Diagnosis not present

## 2020-02-18 DIAGNOSIS — M7541 Impingement syndrome of right shoulder: Secondary | ICD-10-CM | POA: Diagnosis not present

## 2020-02-18 DIAGNOSIS — M19011 Primary osteoarthritis, right shoulder: Secondary | ICD-10-CM | POA: Diagnosis not present

## 2020-02-18 DIAGNOSIS — M1611 Unilateral primary osteoarthritis, right hip: Secondary | ICD-10-CM | POA: Diagnosis not present

## 2020-02-23 DIAGNOSIS — M7541 Impingement syndrome of right shoulder: Secondary | ICD-10-CM | POA: Diagnosis not present

## 2020-02-23 DIAGNOSIS — M1611 Unilateral primary osteoarthritis, right hip: Secondary | ICD-10-CM | POA: Diagnosis not present

## 2020-02-23 DIAGNOSIS — M19011 Primary osteoarthritis, right shoulder: Secondary | ICD-10-CM | POA: Diagnosis not present

## 2020-02-25 DIAGNOSIS — M7541 Impingement syndrome of right shoulder: Secondary | ICD-10-CM | POA: Diagnosis not present

## 2020-02-25 DIAGNOSIS — M19011 Primary osteoarthritis, right shoulder: Secondary | ICD-10-CM | POA: Diagnosis not present

## 2020-02-25 DIAGNOSIS — M1611 Unilateral primary osteoarthritis, right hip: Secondary | ICD-10-CM | POA: Diagnosis not present

## 2020-03-01 DIAGNOSIS — M19011 Primary osteoarthritis, right shoulder: Secondary | ICD-10-CM | POA: Diagnosis not present

## 2020-03-01 DIAGNOSIS — M1611 Unilateral primary osteoarthritis, right hip: Secondary | ICD-10-CM | POA: Diagnosis not present

## 2020-03-01 DIAGNOSIS — M7541 Impingement syndrome of right shoulder: Secondary | ICD-10-CM | POA: Diagnosis not present

## 2020-03-09 DIAGNOSIS — M19011 Primary osteoarthritis, right shoulder: Secondary | ICD-10-CM | POA: Diagnosis not present

## 2020-03-09 DIAGNOSIS — M7541 Impingement syndrome of right shoulder: Secondary | ICD-10-CM | POA: Diagnosis not present

## 2020-03-09 DIAGNOSIS — M1611 Unilateral primary osteoarthritis, right hip: Secondary | ICD-10-CM | POA: Diagnosis not present

## 2020-03-11 DIAGNOSIS — M1611 Unilateral primary osteoarthritis, right hip: Secondary | ICD-10-CM | POA: Diagnosis not present

## 2020-03-11 DIAGNOSIS — M7541 Impingement syndrome of right shoulder: Secondary | ICD-10-CM | POA: Diagnosis not present

## 2020-03-11 DIAGNOSIS — M19011 Primary osteoarthritis, right shoulder: Secondary | ICD-10-CM | POA: Diagnosis not present

## 2020-03-15 DIAGNOSIS — M1611 Unilateral primary osteoarthritis, right hip: Secondary | ICD-10-CM | POA: Diagnosis not present

## 2020-03-15 DIAGNOSIS — M7541 Impingement syndrome of right shoulder: Secondary | ICD-10-CM | POA: Diagnosis not present

## 2020-03-15 DIAGNOSIS — M19011 Primary osteoarthritis, right shoulder: Secondary | ICD-10-CM | POA: Diagnosis not present

## 2020-03-18 DIAGNOSIS — M19011 Primary osteoarthritis, right shoulder: Secondary | ICD-10-CM | POA: Diagnosis not present

## 2020-03-18 DIAGNOSIS — M1611 Unilateral primary osteoarthritis, right hip: Secondary | ICD-10-CM | POA: Diagnosis not present

## 2020-03-18 DIAGNOSIS — M7541 Impingement syndrome of right shoulder: Secondary | ICD-10-CM | POA: Diagnosis not present

## 2020-03-23 DIAGNOSIS — M19011 Primary osteoarthritis, right shoulder: Secondary | ICD-10-CM | POA: Diagnosis not present

## 2020-03-23 DIAGNOSIS — M1611 Unilateral primary osteoarthritis, right hip: Secondary | ICD-10-CM | POA: Diagnosis not present

## 2020-03-23 DIAGNOSIS — M7541 Impingement syndrome of right shoulder: Secondary | ICD-10-CM | POA: Diagnosis not present

## 2020-03-25 DIAGNOSIS — M7541 Impingement syndrome of right shoulder: Secondary | ICD-10-CM | POA: Diagnosis not present

## 2020-03-25 DIAGNOSIS — M19011 Primary osteoarthritis, right shoulder: Secondary | ICD-10-CM | POA: Diagnosis not present

## 2020-03-25 DIAGNOSIS — M1611 Unilateral primary osteoarthritis, right hip: Secondary | ICD-10-CM | POA: Diagnosis not present

## 2020-03-30 DIAGNOSIS — M1611 Unilateral primary osteoarthritis, right hip: Secondary | ICD-10-CM | POA: Diagnosis not present

## 2020-03-30 DIAGNOSIS — M7541 Impingement syndrome of right shoulder: Secondary | ICD-10-CM | POA: Diagnosis not present

## 2020-03-30 DIAGNOSIS — M19011 Primary osteoarthritis, right shoulder: Secondary | ICD-10-CM | POA: Diagnosis not present

## 2020-04-01 DIAGNOSIS — M1611 Unilateral primary osteoarthritis, right hip: Secondary | ICD-10-CM | POA: Diagnosis not present

## 2020-04-01 DIAGNOSIS — M19011 Primary osteoarthritis, right shoulder: Secondary | ICD-10-CM | POA: Diagnosis not present

## 2020-04-01 DIAGNOSIS — M7541 Impingement syndrome of right shoulder: Secondary | ICD-10-CM | POA: Diagnosis not present

## 2020-04-06 DIAGNOSIS — M19011 Primary osteoarthritis, right shoulder: Secondary | ICD-10-CM | POA: Diagnosis not present

## 2020-04-06 DIAGNOSIS — M1611 Unilateral primary osteoarthritis, right hip: Secondary | ICD-10-CM | POA: Diagnosis not present

## 2020-04-06 DIAGNOSIS — M7541 Impingement syndrome of right shoulder: Secondary | ICD-10-CM | POA: Diagnosis not present

## 2020-04-08 DIAGNOSIS — M7541 Impingement syndrome of right shoulder: Secondary | ICD-10-CM | POA: Diagnosis not present

## 2020-04-08 DIAGNOSIS — M1611 Unilateral primary osteoarthritis, right hip: Secondary | ICD-10-CM | POA: Diagnosis not present

## 2020-04-08 DIAGNOSIS — M19011 Primary osteoarthritis, right shoulder: Secondary | ICD-10-CM | POA: Diagnosis not present

## 2020-04-12 DIAGNOSIS — R1013 Epigastric pain: Secondary | ICD-10-CM | POA: Diagnosis not present

## 2020-04-12 DIAGNOSIS — K219 Gastro-esophageal reflux disease without esophagitis: Secondary | ICD-10-CM | POA: Diagnosis not present

## 2020-04-13 DIAGNOSIS — R1013 Epigastric pain: Secondary | ICD-10-CM | POA: Diagnosis not present

## 2020-04-13 DIAGNOSIS — M1611 Unilateral primary osteoarthritis, right hip: Secondary | ICD-10-CM | POA: Diagnosis not present

## 2020-04-13 DIAGNOSIS — M19011 Primary osteoarthritis, right shoulder: Secondary | ICD-10-CM | POA: Diagnosis not present

## 2020-04-13 DIAGNOSIS — M7541 Impingement syndrome of right shoulder: Secondary | ICD-10-CM | POA: Diagnosis not present

## 2020-04-15 DIAGNOSIS — M7541 Impingement syndrome of right shoulder: Secondary | ICD-10-CM | POA: Diagnosis not present

## 2020-04-15 DIAGNOSIS — M1611 Unilateral primary osteoarthritis, right hip: Secondary | ICD-10-CM | POA: Diagnosis not present

## 2020-04-15 DIAGNOSIS — M19011 Primary osteoarthritis, right shoulder: Secondary | ICD-10-CM | POA: Diagnosis not present

## 2020-04-19 DIAGNOSIS — R1013 Epigastric pain: Secondary | ICD-10-CM | POA: Diagnosis not present

## 2020-04-19 DIAGNOSIS — R17 Unspecified jaundice: Secondary | ICD-10-CM | POA: Diagnosis not present

## 2020-04-21 DIAGNOSIS — R17 Unspecified jaundice: Secondary | ICD-10-CM | POA: Diagnosis not present

## 2020-04-21 DIAGNOSIS — R1013 Epigastric pain: Secondary | ICD-10-CM | POA: Diagnosis not present

## 2020-04-24 ENCOUNTER — Inpatient Hospital Stay (HOSPITAL_COMMUNITY)
Admission: EM | Admit: 2020-04-24 | Discharge: 2020-05-11 | DRG: 682 | Disposition: E | Payer: Medicare Other | Attending: Internal Medicine | Admitting: Internal Medicine

## 2020-04-24 ENCOUNTER — Emergency Department (HOSPITAL_COMMUNITY): Payer: Medicare Other

## 2020-04-24 ENCOUNTER — Other Ambulatory Visit: Payer: Self-pay

## 2020-04-24 ENCOUNTER — Encounter (HOSPITAL_COMMUNITY): Payer: Self-pay | Admitting: Internal Medicine

## 2020-04-24 DIAGNOSIS — K7200 Acute and subacute hepatic failure without coma: Secondary | ICD-10-CM | POA: Diagnosis present

## 2020-04-24 DIAGNOSIS — N179 Acute kidney failure, unspecified: Secondary | ICD-10-CM | POA: Diagnosis not present

## 2020-04-24 DIAGNOSIS — R188 Other ascites: Secondary | ICD-10-CM | POA: Diagnosis not present

## 2020-04-24 DIAGNOSIS — K769 Liver disease, unspecified: Secondary | ICD-10-CM

## 2020-04-24 DIAGNOSIS — Z800 Family history of malignant neoplasm of digestive organs: Secondary | ICD-10-CM | POA: Diagnosis not present

## 2020-04-24 DIAGNOSIS — F419 Anxiety disorder, unspecified: Secondary | ICD-10-CM | POA: Diagnosis not present

## 2020-04-24 DIAGNOSIS — R17 Unspecified jaundice: Secondary | ICD-10-CM | POA: Diagnosis not present

## 2020-04-24 DIAGNOSIS — K219 Gastro-esophageal reflux disease without esophagitis: Secondary | ICD-10-CM | POA: Diagnosis present

## 2020-04-24 DIAGNOSIS — Z515 Encounter for palliative care: Secondary | ICD-10-CM | POA: Diagnosis not present

## 2020-04-24 DIAGNOSIS — N170 Acute kidney failure with tubular necrosis: Principal | ICD-10-CM | POA: Diagnosis present

## 2020-04-24 DIAGNOSIS — E860 Dehydration: Secondary | ICD-10-CM | POA: Diagnosis present

## 2020-04-24 DIAGNOSIS — Z8042 Family history of malignant neoplasm of prostate: Secondary | ICD-10-CM | POA: Diagnosis not present

## 2020-04-24 DIAGNOSIS — Z66 Do not resuscitate: Secondary | ICD-10-CM | POA: Diagnosis present

## 2020-04-24 DIAGNOSIS — E785 Hyperlipidemia, unspecified: Secondary | ICD-10-CM | POA: Diagnosis present

## 2020-04-24 DIAGNOSIS — E669 Obesity, unspecified: Secondary | ICD-10-CM | POA: Diagnosis present

## 2020-04-24 DIAGNOSIS — G9340 Encephalopathy, unspecified: Secondary | ICD-10-CM | POA: Diagnosis not present

## 2020-04-24 DIAGNOSIS — G9341 Metabolic encephalopathy: Secondary | ICD-10-CM

## 2020-04-24 DIAGNOSIS — R109 Unspecified abdominal pain: Secondary | ICD-10-CM | POA: Diagnosis not present

## 2020-04-24 DIAGNOSIS — R5383 Other fatigue: Secondary | ICD-10-CM

## 2020-04-24 DIAGNOSIS — K449 Diaphragmatic hernia without obstruction or gangrene: Secondary | ICD-10-CM | POA: Diagnosis not present

## 2020-04-24 DIAGNOSIS — R160 Hepatomegaly, not elsewhere classified: Secondary | ICD-10-CM

## 2020-04-24 DIAGNOSIS — R4182 Altered mental status, unspecified: Secondary | ICD-10-CM | POA: Diagnosis not present

## 2020-04-24 DIAGNOSIS — C61 Malignant neoplasm of prostate: Secondary | ICD-10-CM | POA: Diagnosis not present

## 2020-04-24 DIAGNOSIS — E872 Acidosis: Secondary | ICD-10-CM | POA: Diagnosis present

## 2020-04-24 DIAGNOSIS — R410 Disorientation, unspecified: Secondary | ICD-10-CM | POA: Diagnosis not present

## 2020-04-24 DIAGNOSIS — Z801 Family history of malignant neoplasm of trachea, bronchus and lung: Secondary | ICD-10-CM | POA: Diagnosis not present

## 2020-04-24 DIAGNOSIS — R7401 Elevation of levels of liver transaminase levels: Secondary | ICD-10-CM

## 2020-04-24 DIAGNOSIS — I10 Essential (primary) hypertension: Secondary | ICD-10-CM | POA: Diagnosis present

## 2020-04-24 DIAGNOSIS — Z20822 Contact with and (suspected) exposure to covid-19: Secondary | ICD-10-CM | POA: Diagnosis present

## 2020-04-24 DIAGNOSIS — E7220 Disorder of urea cycle metabolism, unspecified: Secondary | ICD-10-CM | POA: Diagnosis not present

## 2020-04-24 DIAGNOSIS — Z7189 Other specified counseling: Secondary | ICD-10-CM

## 2020-04-24 DIAGNOSIS — Z9079 Acquired absence of other genital organ(s): Secondary | ICD-10-CM | POA: Diagnosis not present

## 2020-04-24 DIAGNOSIS — C787 Secondary malignant neoplasm of liver and intrahepatic bile duct: Secondary | ICD-10-CM | POA: Diagnosis not present

## 2020-04-24 DIAGNOSIS — K5730 Diverticulosis of large intestine without perforation or abscess without bleeding: Secondary | ICD-10-CM | POA: Diagnosis not present

## 2020-04-24 DIAGNOSIS — K7201 Acute and subacute hepatic failure with coma: Secondary | ICD-10-CM | POA: Diagnosis not present

## 2020-04-24 DIAGNOSIS — Z8249 Family history of ischemic heart disease and other diseases of the circulatory system: Secondary | ICD-10-CM | POA: Diagnosis not present

## 2020-04-24 DIAGNOSIS — C221 Intrahepatic bile duct carcinoma: Secondary | ICD-10-CM | POA: Diagnosis present

## 2020-04-24 DIAGNOSIS — E875 Hyperkalemia: Secondary | ICD-10-CM | POA: Diagnosis present

## 2020-04-24 DIAGNOSIS — D689 Coagulation defect, unspecified: Secondary | ICD-10-CM | POA: Diagnosis present

## 2020-04-24 DIAGNOSIS — Z87891 Personal history of nicotine dependence: Secondary | ICD-10-CM | POA: Diagnosis not present

## 2020-04-24 DIAGNOSIS — K8020 Calculus of gallbladder without cholecystitis without obstruction: Secondary | ICD-10-CM | POA: Diagnosis not present

## 2020-04-24 DIAGNOSIS — Z79899 Other long term (current) drug therapy: Secondary | ICD-10-CM

## 2020-04-24 DIAGNOSIS — E876 Hypokalemia: Secondary | ICD-10-CM

## 2020-04-24 DIAGNOSIS — Z8546 Personal history of malignant neoplasm of prostate: Secondary | ICD-10-CM | POA: Diagnosis not present

## 2020-04-24 DIAGNOSIS — R945 Abnormal results of liver function studies: Secondary | ICD-10-CM | POA: Diagnosis not present

## 2020-04-24 LAB — AMMONIA: Ammonia: 65 umol/L — ABNORMAL HIGH (ref 9–35)

## 2020-04-24 LAB — COMPREHENSIVE METABOLIC PANEL
ALT: 295 U/L — ABNORMAL HIGH (ref 0–44)
ALT: 286 U/L — ABNORMAL HIGH (ref 0–44)
AST: 884 U/L — ABNORMAL HIGH (ref 15–41)
AST: 946 U/L — ABNORMAL HIGH (ref 15–41)
Albumin: 2.4 g/dL — ABNORMAL LOW (ref 3.5–5.0)
Albumin: 2.2 g/dL — ABNORMAL LOW (ref 3.5–5.0)
Alkaline Phosphatase: 862 U/L — ABNORMAL HIGH (ref 38–126)
Alkaline Phosphatase: 779 U/L — ABNORMAL HIGH (ref 38–126)
Anion gap: 20 — ABNORMAL HIGH (ref 5–15)
Anion gap: 18 — ABNORMAL HIGH (ref 5–15)
BUN: 96 mg/dL — ABNORMAL HIGH (ref 8–23)
BUN: 100 mg/dL — ABNORMAL HIGH (ref 8–23)
CO2: 17 mmol/L — ABNORMAL LOW (ref 22–32)
CO2: 15 mmol/L — ABNORMAL LOW (ref 22–32)
Calcium: 10.2 mg/dL (ref 8.9–10.3)
Calcium: 9.9 mg/dL (ref 8.9–10.3)
Chloride: 96 mmol/L — ABNORMAL LOW (ref 98–111)
Chloride: 100 mmol/L (ref 98–111)
Creatinine, Ser: 1.54 mg/dL — ABNORMAL HIGH (ref 0.61–1.24)
Creatinine, Ser: 1.33 mg/dL — ABNORMAL HIGH (ref 0.61–1.24)
GFR, Estimated: 48 mL/min — ABNORMAL LOW (ref >60)
GFR, Estimated: 58 mL/min — ABNORMAL LOW (ref >60)
Glucose, Bld: 113 mg/dL — ABNORMAL HIGH (ref 70–99)
Glucose, Bld: 105 mg/dL — ABNORMAL HIGH (ref 70–99)
Potassium: 6.7 mmol/L (ref 3.5–5.1)
Potassium: 7.2 mmol/L (ref 3.5–5.1)
Sodium: 133 mmol/L — ABNORMAL LOW (ref 135–145)
Sodium: 133 mmol/L — ABNORMAL LOW (ref 135–145)
Total Bilirubin: 11.2 mg/dL — ABNORMAL HIGH (ref 0.3–1.2)
Total Bilirubin: 10.7 mg/dL — ABNORMAL HIGH (ref 0.3–1.2)
Total Protein: 6.6 g/dL (ref 6.5–8.1)
Total Protein: 5.8 g/dL — ABNORMAL LOW (ref 6.5–8.1)

## 2020-04-24 LAB — LIPASE, BLOOD: Lipase: 59 U/L — ABNORMAL HIGH (ref 11–51)

## 2020-04-24 LAB — CBC WITH DIFFERENTIAL/PLATELET
Abs Immature Granulocytes: 0.24 10*3/uL — ABNORMAL HIGH (ref 0.00–0.07)
Basophils Absolute: 0 10*3/uL (ref 0.0–0.1)
Basophils Relative: 0 %
Eosinophils Absolute: 0 10*3/uL (ref 0.0–0.5)
Eosinophils Relative: 0 %
HCT: 39.9 % (ref 39.0–52.0)
Hemoglobin: 12.7 g/dL — ABNORMAL LOW (ref 13.0–17.0)
Immature Granulocytes: 2 %
Lymphocytes Relative: 6 %
Lymphs Abs: 0.8 10*3/uL (ref 0.7–4.0)
MCH: 28.8 pg (ref 26.0–34.0)
MCHC: 31.8 g/dL (ref 30.0–36.0)
MCV: 90.5 fL (ref 80.0–100.0)
Monocytes Absolute: 0.7 10*3/uL (ref 0.1–1.0)
Monocytes Relative: 5 %
Neutro Abs: 11.7 10*3/uL — ABNORMAL HIGH (ref 1.7–7.7)
Neutrophils Relative %: 87 %
Platelets: 248 10*3/uL (ref 150–400)
RBC: 4.41 MIL/uL (ref 4.22–5.81)
RDW: 17.2 % — ABNORMAL HIGH (ref 11.5–15.5)
WBC: 13.4 10*3/uL — ABNORMAL HIGH (ref 4.0–10.5)
nRBC: 0.7 % — ABNORMAL HIGH (ref 0.0–0.2)

## 2020-04-24 LAB — URINALYSIS, ROUTINE W REFLEX MICROSCOPIC
Glucose, UA: NEGATIVE mg/dL
Hgb urine dipstick: NEGATIVE
Ketones, ur: NEGATIVE mg/dL
Leukocytes,Ua: NEGATIVE
Nitrite: NEGATIVE
Protein, ur: NEGATIVE mg/dL
Specific Gravity, Urine: 1.016 (ref 1.005–1.030)
pH: 5 (ref 5.0–8.0)

## 2020-04-24 LAB — URIC ACID: Uric Acid, Serum: 14.1 mg/dL — ABNORMAL HIGH (ref 3.7–8.6)

## 2020-04-24 LAB — TYPE AND SCREEN
ABO/RH(D): A POS
Antibody Screen: NEGATIVE

## 2020-04-24 LAB — LACTIC ACID, PLASMA: Lactic Acid, Venous: 9.6 mmol/L (ref 0.5–1.9)

## 2020-04-24 LAB — CBG MONITORING, ED
Glucose-Capillary: 95 mg/dL (ref 70–99)
Glucose-Capillary: 122 mg/dL — ABNORMAL HIGH (ref 70–99)

## 2020-04-24 LAB — PROTIME-INR
INR: 1.6 — ABNORMAL HIGH (ref 0.8–1.2)
Prothrombin Time: 18 seconds — ABNORMAL HIGH (ref 11.4–15.2)

## 2020-04-24 LAB — SARS CORONAVIRUS 2 (TAT 6-24 HRS): SARS Coronavirus 2: NEGATIVE

## 2020-04-24 MED ORDER — ALBUTEROL SULFATE HFA 108 (90 BASE) MCG/ACT IN AERS
4.0000 | INHALATION_SPRAY | Freq: Once | RESPIRATORY_TRACT | Status: AC
  Administered 2020-04-24: 4 via RESPIRATORY_TRACT
  Filled 2020-04-24: qty 6.7

## 2020-04-24 MED ORDER — HYDROMORPHONE HCL 1 MG/ML IJ SOLN
0.5000 mg | INTRAMUSCULAR | Status: DC | PRN
  Administered 2020-04-24: 1 mg via INTRAVENOUS
  Filled 2020-04-24 (×2): qty 1

## 2020-04-24 MED ORDER — GADOBUTROL 1 MMOL/ML IV SOLN: 10.0000 mL | Freq: Once | INTRAVENOUS | Status: DC | PRN

## 2020-04-24 MED ORDER — ACETAMINOPHEN 650 MG RE SUPP: 650.0000 mg | Freq: Four times a day (QID) | RECTAL | Status: DC | PRN

## 2020-04-24 MED ORDER — HALOPERIDOL 0.5 MG PO TABS: 0.5000 mg | ORAL_TABLET | ORAL | Status: DC | PRN

## 2020-04-24 MED ORDER — LACTULOSE 10 GM/15ML PO SOLN
20.0000 g | Freq: Three times a day (TID) | ORAL | Status: DC
  Filled 2020-04-24: qty 30

## 2020-04-24 MED ORDER — HALOPERIDOL LACTATE 5 MG/ML IJ SOLN: 1.0000 mg | INTRAMUSCULAR | Status: DC | PRN

## 2020-04-24 MED ORDER — LORAZEPAM 2 MG/ML IJ SOLN
1.0000 mg | INTRAMUSCULAR | Status: DC | PRN
  Administered 2020-04-25: 1 mg via INTRAVENOUS
  Filled 2020-04-24: qty 1

## 2020-04-24 MED ORDER — LORAZEPAM 2 MG/ML PO CONC: 1.0000 mg | ORAL | Status: DC | PRN

## 2020-04-24 MED ORDER — LORAZEPAM 1 MG PO TABS: 1.0000 mg | ORAL_TABLET | ORAL | Status: DC | PRN

## 2020-04-24 MED ORDER — SCOPOLAMINE 1 MG/3DAYS TD PT72: 1.0000 | MEDICATED_PATCH | TRANSDERMAL | Status: DC

## 2020-04-24 MED ORDER — OXYCODONE HCL 5 MG PO TABS
5.0000 mg | ORAL_TABLET | ORAL | Status: DC | PRN
  Administered 2020-04-24: 2.5 mg via ORAL
  Filled 2020-04-24: qty 1

## 2020-04-24 MED ORDER — MORPHINE 100MG IN NS 100ML (1MG/ML) PREMIX INFUSION
1.0000 mg/h | INTRAVENOUS | Status: DC
  Administered 2020-04-24: 1 mg/h via INTRAVENOUS
  Filled 2020-04-24: qty 100

## 2020-04-24 MED ORDER — GLYCOPYRROLATE 0.2 MG/ML IJ SOLN: 0.4000 mg | INTRAMUSCULAR | Status: DC | PRN

## 2020-04-24 MED ORDER — ACETAMINOPHEN 325 MG PO TABS: 650.0000 mg | ORAL_TABLET | Freq: Four times a day (QID) | ORAL | Status: DC | PRN

## 2020-04-24 MED ORDER — DEXTROSE 50 % IV SOLN
1.0000 | Freq: Once | INTRAVENOUS | Status: AC
  Administered 2020-04-24: 50 mL via INTRAVENOUS
  Filled 2020-04-24: qty 50

## 2020-04-24 MED ORDER — POLYVINYL ALCOHOL 1.4 % OP SOLN: 1.0000 [drp] | Freq: Four times a day (QID) | OPHTHALMIC | Status: DC | PRN

## 2020-04-24 MED ORDER — PHYTONADIONE 5 MG PO TABS
5.0000 mg | ORAL_TABLET | Freq: Once | ORAL | Status: AC
  Administered 2020-04-24: 5 mg via ORAL
  Filled 2020-04-24: qty 1

## 2020-04-24 MED ORDER — ONDANSETRON HCL 4 MG/2ML IJ SOLN: 4.0000 mg | Freq: Four times a day (QID) | INTRAMUSCULAR | Status: DC | PRN

## 2020-04-24 MED ORDER — STERILE WATER FOR INJECTION IV SOLN
INTRAVENOUS | Status: DC
  Filled 2020-04-24: qty 9.62

## 2020-04-24 MED ORDER — ONDANSETRON 4 MG PO TBDP: 4.0000 mg | ORAL_TABLET | Freq: Four times a day (QID) | ORAL | Status: DC | PRN

## 2020-04-24 MED ORDER — BIOTENE DRY MOUTH MT LIQD: 15.0000 mL | OROMUCOSAL | Status: DC | PRN

## 2020-04-24 MED ORDER — SODIUM ZIRCONIUM CYCLOSILICATE 10 G PO PACK
10.0000 g | PACK | Freq: Once | ORAL | Status: AC
  Administered 2020-04-24: 10 g via ORAL
  Filled 2020-04-24: qty 1

## 2020-04-24 MED ORDER — SODIUM CHLORIDE 0.9 % IV BOLUS
1000.0000 mL | Freq: Once | INTRAVENOUS | Status: AC
  Administered 2020-04-24: 1000 mL via INTRAVENOUS

## 2020-04-24 MED ORDER — CALCIUM GLUCONATE-NACL 1-0.675 GM/50ML-% IV SOLN
1.0000 g | Freq: Once | INTRAVENOUS | Status: AC
  Administered 2020-04-24: 1000 mg via INTRAVENOUS
  Filled 2020-04-24: qty 50

## 2020-04-24 MED ORDER — GLYCOPYRROLATE 1 MG PO TABS: 1.0000 mg | ORAL_TABLET | ORAL | Status: DC | PRN

## 2020-04-24 MED ORDER — GLYCOPYRROLATE 0.2 MG/ML IJ SOLN: 0.2000 mg | INTRAMUSCULAR | Status: DC | PRN

## 2020-04-24 MED ORDER — LACTULOSE 10 GM/15ML PO SOLN
20.0000 g | Freq: Once | ORAL | Status: AC
  Administered 2020-04-24: 20 g via ORAL
  Filled 2020-04-24: qty 30

## 2020-04-24 MED ORDER — MORPHINE BOLUS VIA INFUSION
2.0000 mg | INTRAVENOUS | Status: DC | PRN
  Administered 2020-04-24 – 2020-04-25 (×3): 2 mg via INTRAVENOUS
  Filled 2020-04-24: qty 2

## 2020-04-24 MED ORDER — INSULIN ASPART 100 UNIT/ML IV SOLN
5.0000 [IU] | Freq: Once | INTRAVENOUS | Status: AC
  Administered 2020-04-24: 5 [IU] via INTRAVENOUS
  Filled 2020-04-24: qty 0.05

## 2020-04-24 MED ORDER — HALOPERIDOL LACTATE 2 MG/ML PO CONC: 0.5000 mg | ORAL | Status: DC | PRN

## 2020-04-24 NOTE — ED Notes (Signed)
Patient transported to MRI 

## 2020-04-24 NOTE — ED Triage Notes (Signed)
Diagnosed with tumor on liver last Wednesday-- presents today with jaundice, altered mental status, dehydration and decreased appetite.

## 2020-04-24 NOTE — Progress Notes (Signed)
Failed attempt at MRI. Patient continues to move in MRI and attempts to remove imaging equipment. A VERY limited MRI of the abdomen without gadolinium was completed.

## 2020-04-24 NOTE — ED Notes (Addendum)
Bethena Roys, pt spouse, said this morning pt mental status declined. He stood up and urinated. Unaware he urinated. His arms were jerking so badly he couldn't hold toilet paper. She called GI provider around 4 AM, and they recommended she bring him to ED. She said his eyes appear more jaundiced today and he is dehydrated.

## 2020-04-24 NOTE — ED Notes (Signed)
Date and time results received: 05/06/2020 3:46 PM  Test: Lactic Acid Critical Value: 9.6  Name of Provider Notified: Attending

## 2020-04-24 NOTE — ED Notes (Signed)
Date and time results received: 05/01/2020 3:46 PM  Test: Potassium Critical Value: 7.2  Name of Provider Notified: Attending

## 2020-04-24 NOTE — Consult Note (Addendum)
[place]  [phone]) [phone]   HEMATOLOGY ONCOLOGY INPATIENT CONSULTATION   Juan Trevino  DOB: 1950/04/24  MR#: [id number]  CSN#: [id number]    Requesting Physician: ED physician Dr. Roslynn Amble   Patient Care Team: Antony Contras, MD as PCP - General (Family Medicine)  Reason for consult: liver masses   History of present illness: 70 yo male with PMH of prostate cancer, status post total prostatectomy in May 2020, which showed T3N0 disease, Gleason score 7, hypertension, presented with worsening fatigue, abdominal discomfort and confusion to emergency room today.  Most history is from his wife Bethena Roys at bedside.  He has been in his usual health status on 2 April 07, 2020, after his holiday reunion with his children, he developed hiccups, fatigue, and epigastric discomfort.  He initially thought was acid reflux, however his symptoms progressed, he developed jaundice, low appetite, and worsening fatigue.  He reached out to his GI Dr. Liliane Channel office, and was seen on April 19, 2020, lab reviewed abnormal liver function with bilirubin 7.4.  CT abdomen pelvis with obtained on April 21, 2020, which showed diffuse bilobular liver lesions, concerning for cholangiocarcinoma.  He was referred to [facility] oncologist, with appointment next Tuesday.  Due to worsening fatigue, confusion, not able to eat and drink much in the past 2 days, his wife brought him to the emergency room today for further evaluation.   MEDICAL HISTORY:  Past Medical History:  Diagnosis Date  . Diverticulitis 2005  . GERD (gastroesophageal reflux disease)   . Hyperlipidemia   . Hypertension   . Prostate cancer ([place])   . Thyroid disease     SURGICAL HISTORY: Past Surgical History:  Procedure Laterality Date  . colonoscopy     . HERNIA REPAIR     umbilical  . hydrocelectomy     . LYMPHADENECTOMY Bilateral 08/14/2018   Procedure: LYMPHADENECTOMY;  Surgeon: Alexis Frock, MD;  Location: [facility]  ORS;  Service: Urology;  Laterality: Bilateral;  . PROSTATE BIOPSY    . ROBOT ASSISTED LAPAROSCOPIC RADICAL PROSTATECTOMY N/A 08/14/2018   Procedure: XI ROBOTIC ASSISTED LAPAROSCOPIC RADICAL PROSTATECTOMY;  Surgeon: Alexis Frock, MD;  Location: [facility] ORS;  Service: Urology;  Laterality: N/A;  3 HRS  . UPPER GI ENDOSCOPY    . vasectomy      SOCIAL HISTORY: Social History   Socioeconomic History  . Marital status: Married    Spouse name: Not on file  . Number of children: 2  . Years of education: Not on file  . Highest education level: Not on file  Occupational History  . Not on file  Tobacco Use  . Smoking status: Former Smoker    Packs/day: 1.50    Years: 18.00    Pack years: 27.00    Types: Cigarettes    Quit date: 01/29/1986    Years since quitting: 34.2  . Smokeless tobacco: Never Used  Vaping Use  . Vaping Use: Never used  Substance and Sexual Activity  . Alcohol use: Yes    Alcohol/week: 8.0 standard drinks    Types: 8 Cans of beer per week  . Drug use: No  . Sexual activity: Yes  Other Topics Concern  . Not on file  Social History Narrative   Caffienated drinks-yes   Seat belt use often-yes   Regular Exercise-yes   Smoke alarm in the home-yes      Resides in [place]. Retired.            Social Determinants of  Health   Financial Resource Strain: Not on file  Food Insecurity: Not on file  Transportation Needs: Not on file  Physical Activity: Not on file  Stress: Not on file  Social Connections: Not on file  Intimate Partner Violence: Not on file    FAMILY HISTORY: Family History  Problem Relation Age of Onset  . Colon cancer Mother   . Hypertension Mother   . Prostate cancer Father   . Lung cancer Father   . Hypertension Father   . Breast cancer Neg Hx   . Pancreatic cancer Neg Hx     ALLERGIES:  has No Known Allergies.  MEDICATIONS:  Current Facility-Administered Medications  Medication Dose Route Frequency Provider Last Rate Last  Admin  . gadobutrol (GADAVIST) 1 MMOL/ML injection 10 mL  10 mL Intravenous Once PRN Lucrezia Starch, MD      . HYDROmorphone (DILAUDID) injection 0.5-1 mg  0.5-1 mg Intravenous Q2H PRN Wynetta Fines T, MD      . lactulose (CHRONULAC) 10 GM/15ML solution 20 g  20 g Oral TID Ronnette Juniper, MD      . oxyCODONE (Oxy IR/ROXICODONE) immediate release tablet 5 mg  5 mg Oral Q3H PRN Wynetta Fines T, MD      . phytonadione (VITAMIN K) tablet 5 mg  5 mg Oral Once Wynetta Fines T, MD      . sodium chloride 0.225 % with sodium bicarbonate 50 mEq infusion   Intravenous Continuous Wynetta Fines T, MD 100 mL/hr at 04/11/2020 1446 New Bag at 04/20/2020 1446  . sodium zirconium cyclosilicate (LOKELMA) packet 10 g  10 g Oral Once Lequita Halt, MD       Current Outpatient Medications  Medication Sig Dispense Refill  . amLODipine (NORVASC) 5 MG tablet Take 5 mg by mouth at bedtime.     . Baclofen 5 MG TABS Take 5 mg by mouth 3 (three) times daily.    Marland Kitchen lisinopril (PRINIVIL,ZESTRIL) 40 MG tablet Take 40 mg by mouth at bedtime.     Marland Kitchen oxyCODONE (OXY IR/ROXICODONE) 5 MG immediate release tablet Take 5 mg by mouth 4 (four) times daily as needed for pain.      REVIEW OF SYSTEMS (per pt's wife):   Constitutional: Denies fevers, chills or abnormal night sweats, (+) severe fatigue and confusion Eyes: (+) Jaundice Ears, nose, mouth, throat, and face: Denies mucositis or sore throat Respiratory: Denies cough, dyspnea or wheezes Cardiovascular: Denies palpitation, chest discomfort or lower extremity swelling Gastrointestinal:  See HPI  Lymphatics: Denies new lymphadenopathy or easy bruising Neurological:Denies numbness, tingling or new weaknesses Behavioral/Psych: Mood is stable, no new changes  All other systems were reviewed with the patient and are negative.  PHYSICAL EXAMINATION: ECOG PERFORMANCE STATUS: 4 - Bedbound  Vitals:   05/05/2020 1245 05/03/2020 1300  BP: (!) 119/52 (!) 115/44  Pulse: 90 92  Resp: 18 19  Temp:     SpO2: 96% 97%   Filed Weights   04/20/2020 0701  Weight: 205 lb (93 kg)    GENERAL:alert, no distress and comfortable SKIN: skin color, texture, turgor are normal, no rashes or significant lesions EYES: normal, conjunctiva are pink and non-injected, sclera clear OROPHARYNX:no exudate, no erythema and lips, buccal mucosa, and tongue normal  NECK: supple, thyroid normal size, non-tender, without nodularity LYMPH:  no palpable lymphadenopathy in the cervical, axillary or inguinal LUNGS: clear to auscultation and percussion with normal breathing effort HEART: regular rate & rhythm and no murmurs and no lower extremity  edema ABDOMEN:abdomen soft, non-tender and normal bowel sounds Musculoskeletal:no cyanosis of digits and no clubbing  PSYCH: alert & oriented x 3 with fluent speech NEURO: no focal motor/sensory deficits  LABORATORY DATA:  I have reviewed the data as listed Lab Results  Component Value Date   WBC 13.4 (H) 04/18/2020   HGB 12.7 (L) 04/26/2020   HCT 39.9 05/08/2020   MCV 90.5 04/13/2020   PLT 248 04/13/2020   Recent Labs    05/06/2020 0900  NA 133*  K 6.7*  CL 96*  CO2 17*  GLUCOSE 113*  BUN 96*  CREATININE 1.54*  CALCIUM 10.2  GFRNONAA 48*  PROT 6.6  ALBUMIN 2.4*  AST 884*  ALT 295*  ALKPHOS 862*  BILITOT 11.2*    RADIOGRAPHIC STUDIES: I have personally reviewed the radiological images as listed and agreed with the findings in the report. MR ABDOMEN LIMITED  Result Date: 04/29/2020 CLINICAL DATA:  New diagnosis of hepatobiliary cancer reportedly last week at an outside facility. Patient presents with jaundice, altered mental status, decreased appetite and dehydration. EXAM: MRI ABDOMEN WITHOUT CONTRAST TECHNIQUE: Multiplanar multisequence MR imaging was performed without the administration of intravenous contrast. Examination discontinued due to patient inability to remain still and follow instructions. Axial and coronal T2 sequences were obtained. No T1  imaging, chemical shift imaging or diffusion-weighted sequences obtained. COMPARISON:   abdominal sonogram. 02/16/2011 CT abdomen/pelvis. FINDINGS: Very limited motion degraded noncontrast MRI study lacking multiple basic sequences as above. Lower chest: No acute abnormality at the lung bases. Hepatobiliary: Hepatomegaly. Innumerable (> than 20) mildly T2 hyperintense liver masses scattered throughout the liver, for example measuring 3.4 x 2.4 cm in the segment 4A left liver lobe (series 3/image 14), 4.2 x 2.4 cm in the segment 4B left liver lobe (series 3/image 32) and 2.4 x 2.3 cm in the central segment 5 right liver (series 3/image 27). In addition, there is abnormal T2 parenchymal signal intensity replacing most of the liver with geographic areas of sparing in the right liver, suspicious for widely infiltrative neoplasm. Nondistended gallbladder. Mild diffuse gallbladder wall thickening. No cholelithiasis. No appreciable intrahepatic biliary ductal dilatation. Common bile duct diameter 2 mm. No evidence of choledocholithiasis or biliary strictures. Pancreas: No pancreatic mass or duct dilation.  No pancreas divisum. Spleen: Normal size. No mass. Adrenals/Urinary Tract: Normal adrenals. No hydronephrosis. Normal kidneys with no renal mass. Stomach/Bowel: Small to moderate hiatal hernia. Otherwise normal nondistended stomach. Visualized small and large bowel is normal caliber, with no bowel wall thickening. Colonic diverticulosis. Vascular/Lymphatic: Normal caliber abdominal aorta. Mildly enlarged 1.0 cm right pericardiophrenic lymph node (series 3/image 13). Enlarged 1.8 cm porta hepatis node (series 3/image 28). Mildly enlarged 1.4 cm portacaval node (series 3/image 29). Enlarged 1.3 cm high left para-aortic node (series 3/image 28). Other: Trace perihepatic ascites.  No focal fluid collection. Musculoskeletal: No aggressive appearing focal osseous lesions. IMPRESSION: 1. Very limited motion degraded  noncontrast MRI study, see comments. 2. Innumerable (> than 20) mildly T2 hyperintense liver masses scattered throughout the liver, compatible with widespread liver metastases. 3. Abnormal liver parenchymal T2 signal intensity replacing most of the liver with geographic areas of sparing in the right liver, suspicious for widely infiltrative liver malignancy. Differential includes hepatocellular carcinoma or cholangiocarcinoma, favoring hepatocellular carcinoma. 4. Mild porta hepatis, portacaval and high left para-aortic lymphadenopathy, concerning for metastatic disease. 5. Trace perihepatic ascites. 6. Small to moderate hiatal hernia. 7. Colonic diverticulosis. Electronically Signed   By: Ilona Sorrel M.D.   On: 04/16/2020 14:00  US Abdomen Limited RUQ (LIVER/GB)  Result Date: 05/01/2020 CLINICAL DATA:  Right upper quadrant abdominal pain for 1 week. Elevated bilirubin. Jaundice. 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 EXAM: ULTRASOUND ABDOMEN LIMITED RIGHT UPPER QUADRANT COMPARISON:  02/16/2011 CT abdomen/pelvis. FINDINGS: Gallbladder: No shadowing gallstones. Mild gallbladder sludge. Relatively contracted gallbladder without definite gallbladder wall thickening. No pericholecystic fluid or sonographic Murphy's sign. Common bile duct: Diameter: 3 mm Liver: Multiple ill-defined mildly hyperechoic masses scattered throughout the liver, with representative 2.6 x 2.2 x 2.4 cm posterior left liver mass and 3.3 x 2.5 x 2.9 cm anterior right liver mass. Portal vein is patent on color Doppler imaging with normal direction of blood flow towards the liver. Other: Trace perihepatic ascites. IMPRESSION: 1. Multiple ill-defined mildly hyperechoic liver masses scattered throughout the liver, largest approximately 3.3 cm in the right liver, suspicious for metastatic disease. MRI abdomen without and with IV contrast recommended for further characterization. 2. No biliary  ductal dilatation. 3. Contracted gallbladder with sludge and no cholelithiasis. Electronically Signed   By: Ilona Sorrel M.D.   On: 04/11/2020 09:34    ASSESSMENT & PLAN: male with PMH of prostate cancer, status post total prostatectomy in May 2020, which showed T3N0 disease, Gleason score 7, hypertension, presented with worsening fatigue, abdominal discomfort and confusion  1. Diffuse liver lesions, highly concerning for malignancy, likely cholangiocarcinoma, or metastatic disease 2.  Hyperbilirubinemia, transaminitis, secondary to #1 3. AKI 4.  Metabolic hepatic encephalopathy 5.  Hypokalemia 6. History of T3N0 prostate cancer, status post total prostatectomy, Gleason score 7 7. DNR  Recommendations: -I have reviewed his CT [facility] from [facility] on 04/21/2020 which showed diffuse malignant replacement of the liver and associated adenopathy in the upper abdomen and lower paraesophageal station, concerning for cholangiocarcinoma versus metastatic disease. No pancreatic or other primary lesion seen on the CT  -will obtain CT chest wo contrast (I will order it) to complete staging and rule out thoracic primary  -we discuss liver biopsy for tissue diagnosis.  We discussed if this is cholangiocarcinoma or other metastatic cancer, will unlikely able to offer any treatment due to his liver failure, unless this is small cell or lymphoma which we may try chemo. Pt's wife thinks pt and his children would like to get the diagnosis and she agrees with biopsy, will consult IR -We also discussed CODE STATUS, she agrees with DNR -We discussed goals of care, and likely hospice referral, if treatment is not available, she is interested.   -I will f/u    All questions were answered. The patient knows to call the clinic with any problems, questions or concerns.      Truitt Merle, MD 05/01/2020 3:36 PM   Addendum  I spoke with admitting physician Dr. Roosevelt Locks, who feels his acidosis and hyperkalemia is unlikely reversible  and he anticipate pt will likely die soon. I will cancel his CT and biopsy then. Pt's wife has agreed with comfort care, per Dr. Roosevelt Locks. I will f/u as needed.  Truitt Merle  04/18/2020

## 2020-04-24 NOTE — Progress Notes (Addendum)
Repeat labs showed worsening of acidosis as well potassium level despite bicarb drip and other measures. Went to see the patient patient is obtunded now, very little response.  Discussed with wife at bedside regarding critical lab, the only treatment to save patient's life will be emergent dialysis. Given patient has signs of multiorgan failure, with liver failure, worsening of kidney failure and also worsening of encephalopathy, chances of survival is minimal even with aggressive management.  Aligned with patient living will, wife agreed with comfort measures only.  We will admit patient to hospice.  Case was discussed with nephrology, oncology.

## 2020-04-24 NOTE — H&P (Signed)
History and Physical    Juan Trevino FBP:[id number] DOB: 11-01-1950 DOA: 05/23/20  PCP: Antony Contras, MD (Confirm with patient/family/NH records and if not entered, this has to be entered at [facility] point of entry) Patient coming from: Home  I have personally briefly reviewed patient's old medical records in [place]  Chief Complaint: Abdominal pain  HPI: Juan Trevino is a 70 y.o. male with medical history significant of HTN, HLD, prostate CA status post radical prostatectomy, presented with increasing abdominal pain, loss of appetite and severe dehydration.  Symptoms started about 2 weeks ago, initially, patient developed a moderate to severe abdominal pain, located on epigastric area radiating to the back, associated with nausea and fullness.  Occasionally also had symptoms of night sweating, bloating sensation.  Gradually, he lost appetite, and even drinking liquid causes significant stomach sick.  None he started noticed decreased urinary output and urine color has been dark brown.  Denied any fever chills.  He went to see gastroenterologist at [facility] who ordered a CAT scan on 04/21/2020 which showed diffuse malignant replacement of the liver associated with adenopathy involving upper abdominal and lower esophageal station.  Last 2 days, patient's symptoms has significant gotten worse with excruciating abdominal pain, unable to take any liquids or food down.  And family noticed his skin color has turned significantly yellow at the meantime. ED Course: Significant AKI with potassium 6.7, bicarb 17 and creatinine 1.5 compared to baseline 0.8.  INR 1.6 AST 800 ALT more than 290, bilirubin 11.  Oncology and GI both consulted, MRCP underway.  Review of Systems: As per HPI otherwise 14 point review of systems negative.    Past Medical History:  Diagnosis Date  . Diverticulitis 2005  . GERD (gastroesophageal reflux disease)   . Hyperlipidemia   . Hypertension   . Prostate cancer  ([place])   . Thyroid disease     Past Surgical History:  Procedure Laterality Date  . colonoscopy     . HERNIA REPAIR     umbilical  . hydrocelectomy     . LYMPHADENECTOMY Bilateral 08/14/2018   Procedure: LYMPHADENECTOMY;  Surgeon: Alexis Frock, MD;  Location: [facility];  Service: Urology;  Laterality: Bilateral;  . PROSTATE BIOPSY    . ROBOT ASSISTED LAPAROSCOPIC RADICAL PROSTATECTOMY N/A 08/14/2018   Procedure: XI ROBOTIC ASSISTED LAPAROSCOPIC RADICAL PROSTATECTOMY;  Surgeon: Alexis Frock, MD;  Location: [facility];  Service: Urology;  Laterality: N/A;  3 HRS  . UPPER GI ENDOSCOPY    . vasectomy       reports that he quit smoking about 34 years ago. His smoking use included cigarettes. He has a 27.00 pack-year smoking history. He has never used smokeless tobacco. He reports current alcohol use of about 8.0 standard drinks of alcohol per week. He reports that he does not use drugs.  No Known Allergies  Family History  Problem Relation Age of Onset  . Colon cancer Mother   . Hypertension Mother   . Prostate cancer Father   . Lung cancer Father   . Hypertension Father   . Breast cancer Neg Hx   . Pancreatic cancer Neg Hx      Prior to Admission medications   Medication Sig Start Date End Date Taking? Authorizing Provider  amLODipine (NORVASC) 5 MG tablet Take 5 mg by mouth at bedtime.  03/01/18  Yes [provider]  Baclofen 5 MG TABS Take 5 mg by mouth 3 (three) times daily. 04/23/20  Yes [provider]  lisinopril (PRINIVIL,ZESTRIL) 40 MG tablet Take 40 mg by mouth at bedtime.  03/01/18  Yes [provider]  oxyCODONE (OXY IR/ROXICODONE) 5 MG immediate release tablet Take 5 mg by mouth 4 (four) times daily as needed for pain. 04/21/20  Yes [provider]    Physical Exam: Vitals:   04/12/2020 1215 05/03/2020 1230 04/20/2020 1245 04/10/2020 1300  BP: 133/62 (!) 120/53 (!) 119/52 (!) 115/44  Pulse: (!) 105 96 90 92  Resp: (!) 22 18 18 19   Temp:       TempSrc:      SpO2: 97% 96% 96% 97%  Weight:      Height:        Constitutional: NAD, calm, comfortable Vitals:   05/04/2020 1215 04/18/2020 1230 05/10/2020 1245 05/01/2020 1300  BP: 133/62 (!) 120/53 (!) 119/52 (!) 115/44  Pulse: (!) 105 96 90 92  Resp: (!) 22 18 18 19   Temp:      TempSrc:      SpO2: 97% 96% 96% 97%  Weight:      Height:       Eyes: PERRL, lids and conjunctivae normal, jaundice noticed ENMT: Mucous membranes are dry. Posterior pharynx clear of any exudate or lesions.Normal dentition.  Neck: normal, supple, no masses, no thyromegaly Respiratory: clear to auscultation bilaterally, no wheezing, no crackles. Normal respiratory effort. No accessory muscle use.  Cardiovascular: Regular rate and rhythm, no murmurs / rubs / gallops. No extremity edema. 2+ pedal pulses. No carotid bruits.  Abdomen: Distended and severe tenderness on epigastric area, no rebound no guarding.  Large mass palpated on Epigastric area..  Musculoskeletal: no clubbing / cyanosis. No joint deformity upper and lower extremities. Good ROM, no contractures. Normal muscle tone.  Skin: no rashes, lesions, ulcers. No induration Neurologic: CN 2-12 grossly intact. Sensation intact, DTR normal. Strength 5/5 in all 4.  Psychiatric: Normal judgment and insight. Alert and oriented x 3. Normal mood.     Labs on Admission: I have personally reviewed following labs and imaging studies  CBC: Recent Labs  Lab 04/18/2020 0900  WBC 13.4*  NEUTROABS 11.7*  HGB 12.7*  HCT 39.9  MCV 90.5  PLT [id number]   Basic Metabolic Panel: Recent Labs  Lab 04/16/2020 0900  NA 133*  K 6.7*  CL 96*  CO2 17*  GLUCOSE 113*  BUN 96*  CREATININE 1.54*  CALCIUM 10.2   GFR: Estimated Creatinine Clearance: 49 mL/min (A) (by C-G formula based on SCr of 1.54 mg/dL (H)). Liver Function Tests: Recent Labs  Lab 05/06/2020 0900  AST 884*  ALT 295*  ALKPHOS 862*  BILITOT 11.2*  PROT 6.6  ALBUMIN 2.4*   Recent Labs  Lab  04/15/2020 0900  LIPASE 59*   Recent Labs  Lab 04/13/2020 0900  AMMONIA 65*   Coagulation Profile: Recent Labs  Lab 05/07/2020 0900  INR 1.6*   Cardiac Enzymes: No results for input(s): CKTOTAL, CKMB, CKMBINDEX, TROPONINI in the last 168 hours. BNP (last 3 results) No results for input(s): PROBNP in the last 8760 hours. HbA1C: No results for input(s): HGBA1C in the last 72 hours. CBG: Recent Labs  Lab 04/24/20 0905 04/24/20 1236  GLUCAP 95 122*   Lipid Profile: No results for input(s): CHOL, HDL, LDLCALC, TRIG, CHOLHDL, LDLDIRECT in the last 72 hours. Thyroid Function Tests: No results for input(s): TSH, T4TOTAL, FREET4, T3FREE, THYROIDAB in the last 72 hours. Anemia Panel: No results for input(s): VITAMINB12, FOLATE, FERRITIN, TIBC, IRON, RETICCTPCT in the last 72 hours. Urine  analysis:    Component Value Date/Time   COLORURINE AMBER (A) 05/09/2020 0900   APPEARANCEUR HAZY (A) 04/15/2020 0900   LABSPEC 1.016 04/15/2020 0900   PHURINE 5.0 04/17/2020 0900   GLUCOSEU NEGATIVE 04/20/2020 0900   GLUCOSEU NEGATIVE 11/06/2012 1401   HGBUR NEGATIVE 04/26/2020 0900   BILIRUBINUR SMALL (A) 04/23/2020 0900   KETONESUR NEGATIVE 04/18/2020 0900   PROTEINUR NEGATIVE 04/17/2020 0900   UROBILINOGEN 1.0 11/06/2012 1401   NITRITE NEGATIVE 04/19/2020 0900   LEUKOCYTESUR NEGATIVE 04/21/2020 0900    Radiological Exams on Admission: US Abdomen Limited RUQ (LIVER/GB)  Result Date: 05/09/2020 CLINICAL DATA:  Right upper quadrant abdominal pain for 1 week. Elevated bilirubin. Jaundice. 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 EXAM: ULTRASOUND ABDOMEN LIMITED RIGHT UPPER QUADRANT COMPARISON:  02/16/2011 CT abdomen/pelvis. FINDINGS: Gallbladder: No shadowing gallstones. Mild gallbladder sludge. Relatively contracted gallbladder without definite gallbladder wall thickening. No pericholecystic fluid or sonographic Murphy's sign. Common bile  duct: Diameter: 3 mm Liver: Multiple ill-defined mildly hyperechoic masses scattered throughout the liver, with representative 2.6 x 2.2 x 2.4 cm posterior left liver mass and 3.3 x 2.5 x 2.9 cm anterior right liver mass. Portal vein is patent on color Doppler imaging with normal direction of blood flow towards the liver. Other: Trace perihepatic ascites. IMPRESSION: 1. Multiple ill-defined mildly hyperechoic liver masses scattered throughout the liver, largest approximately 3.3 cm in the right liver, suspicious for metastatic disease. MRI abdomen without and with IV contrast recommended for further characterization. 2. No biliary ductal dilatation. 3. Contracted gallbladder with sludge and no cholelithiasis. Electronically Signed   By: Ilona Sorrel M.D.   On: 05/04/2020 09:34    EKG: Pending  Assessment/Plan Active Problems:   AKI (acute kidney injury) ([place])  (please populate well all problems here in Problem List. (For example, if patient is on BP meds at home and you resume or decide to hold them, it is a problem that needs to be her. Same for CAD, COPD, HLD and so on)  Hyperkalemia -Secondary to AKI likely ATN from prolonged dehydration -Received cocktail of insulin glucose calcium gluconate and bicarb. -I propose Foley to document accurately output but patient refused. -Start bicarb drip -Patient clearly does not want any hemodialysis or other artificial breathing to prolong life. -Repeat BMP in 4 hours -LoKelma and lactulose -Uric acid, to rule out tumor lysis  Acute non-gap And anion gap of medical acidosis -From severe dehydration/ATN, as well as starvation  Metastatic liver/cholangio-CA -With such extensiveness and organ involvement, suspect end-stage and not salvageable. -Discussed with patient and his wife at bedside, while waiting for oncology evaluation, will consult palliative care. -Confirmed DNR/DNI.  Acute liver failure -Elevated INR/coagulopathy and profound  bilirubinemia and transaminitis, and elevated ammonia -Symptomatic management with vitamin K, lactulose    DVT prophylaxis: INR=1.6 Code Status: DNR Family Communication: Wife at bedside Disposition Plan: Expect more than 2 midnight hospital stay and probably bridging for hospice Consults called: Oncology, GI, palliative care. Admission status: PCU   Lequita Halt MD [facility] Pager [phone]  04/13/2020, 1:43 PM

## 2020-04-24 NOTE — ED Provider Notes (Signed)
[name]   CSN: [id number] Arrival date & time: 04/12/2020  0557     History Chief Complaint  Patient presents with  . Altered Mental Status    Juan Trevino is a 70 y.o. male.  Presenting to ER with concern for multiple complaints.  Since Christmas, patient started having general GI upset, nausea and malaise.  Went to his gastroenterologist where he had blood work done that was abnormal and a CT scan that was abnormal.  The CT was concerning for possible cholangiocarcinoma versus metastatic process.  He was referred to oncology but had not had initial appointment.  Over the past couple days, wife states he has had significant overall decline.  Seems to be intermittently confused, had an episode of urinary incontinence.  Worsened indigestion and increased belching.  Worsening yellowing of skin and abdominal distention.  Patient denies any pain at present.  Answering questions appropriately.  HPI     Past Medical History:  Diagnosis Date  . Diverticulitis 2005  . GERD (gastroesophageal reflux disease)   . Hyperlipidemia   . Hypertension   . Prostate cancer ([place])   . Thyroid disease     Patient Active Problem List   Diagnosis Date Noted  . Prostate cancer ([place]) 08/14/2018  . Malignant neoplasm of prostate ([place]) 06/18/2018  . Obesity (BMI 30.0-34.9) 11/06/2012  . Other abnormal glucose 11/06/2011  . GERD (gastroesophageal reflux disease) 11/06/2011  . Essential hypertension, benign 11/06/2011  . Routine general medical examination at a health care facility 11/06/2011    Past Surgical History:  Procedure Laterality Date  . colonoscopy     . HERNIA REPAIR     umbilical  . hydrocelectomy     . LYMPHADENECTOMY Bilateral 08/14/2018   Procedure: LYMPHADENECTOMY;  Surgeon: Alexis Frock, MD;  Location: [facility];  Service: Urology;  Laterality: Bilateral;  . PROSTATE BIOPSY    . ROBOT ASSISTED LAPAROSCOPIC RADICAL PROSTATECTOMY N/A  08/14/2018   Procedure: XI ROBOTIC ASSISTED LAPAROSCOPIC RADICAL PROSTATECTOMY;  Surgeon: Alexis Frock, MD;  Location: [facility];  Service: Urology;  Laterality: N/A;  3 HRS  . UPPER GI ENDOSCOPY    . vasectomy         Family History  Problem Relation Age of Onset  . Colon cancer Mother   . Hypertension Mother   . Prostate cancer Father   . Lung cancer Father   . Hypertension Father   . Breast cancer Neg Hx   . Pancreatic cancer Neg Hx     Social History   Tobacco Use  . Smoking status: Former Smoker    Packs/day: 1.50    Years: 18.00    Pack years: 27.00    Types: Cigarettes    Quit date: 01/29/1986    Years since quitting: 34.2  . Smokeless tobacco: Never Used  Vaping Use  . Vaping Use: Never used  Substance Use Topics  . Alcohol use: Yes    Alcohol/week: 8.0 standard drinks    Types: 8 Cans of beer per week  . Drug use: No    Home Medications Prior to Admission medications   Medication Sig Start Date End Date Taking? Authorizing Provider  amLODipine (NORVASC) 5 MG tablet Take 5 mg by mouth at bedtime.  03/01/18  Yes [provider]  Baclofen 5 MG TABS Take 5 mg by mouth 3 (three) times daily. 04/23/20  Yes [provider]  lisinopril (PRINIVIL,ZESTRIL) 40 MG tablet Take 40 mg by mouth at bedtime.  03/01/18  Yes [provider]  oxyCODONE (OXY IR/ROXICODONE) 5 MG immediate release tablet Take 5 mg by mouth 4 (four) times daily as needed for pain. 04/21/20  Yes [provider]    Allergies    Patient has no known allergies.  Review of Systems   Review of Systems  Constitutional: Positive for fatigue. Negative for chills and fever.  HENT: Negative for ear pain and sore throat.   Eyes: Negative for pain and visual disturbance.  Respiratory: Negative for cough and shortness of breath.   Cardiovascular: Negative for chest pain and palpitations.  Gastrointestinal: Positive for abdominal distention and nausea. Negative for abdominal  pain and vomiting.  Genitourinary: Positive for frequency. Negative for dysuria and hematuria.  Musculoskeletal: Negative for arthralgias and back pain.  Skin: Negative for color change and rash.  Neurological: Negative for seizures and syncope.  All other systems reviewed and are negative.   Physical Exam Updated Vital Signs BP (!) 141/92   Pulse 85   Temp 98.1 F (36.7 C) (Oral)   Resp (!) 24   Ht 6' (1.829 m)   Wt 93 kg   SpO2 99%   BMI 27.80 kg/m   Physical Exam Vitals and nursing note reviewed.  Constitutional:      Comments: Chronically ill but not in acute distress, obvious jaundice skin  HENT:     Head: Normocephalic.     Nose: Nose normal.     Mouth/Throat:     Mouth: Mucous membranes are moist.  Eyes:     General: Scleral icterus present.     Pupils: Pupils are equal, round, and reactive to light.  Cardiovascular:     Rate and Rhythm: Normal rate.     Pulses: Normal pulses.  Pulmonary:     Effort: Pulmonary effort is normal. No respiratory distress.     Breath sounds: No wheezing or rales.  Abdominal:     General: There is distension.     Tenderness: There is no abdominal tenderness. There is no guarding or rebound.  Musculoskeletal:        General: No deformity or signs of injury.     Cervical back: Normal range of motion.  Skin:    General: Skin is dry.     Coloration: Skin is jaundiced and pale.  Neurological:     General: No focal deficit present.     Mental Status: He is oriented to person, place, and time.  Psychiatric:        Mood and Affect: Mood normal.        Behavior: Behavior normal.     ED Results / Procedures / Treatments   Labs (all labs ordered are listed, but only abnormal results are displayed) Labs Reviewed  COMPREHENSIVE METABOLIC PANEL - Abnormal; Notable for the following components:      Result Value   Sodium 133 (*)    Potassium 6.7 (*)    Chloride 96 (*)    CO2 17 (*)    Glucose, Bld 113 (*)    BUN 96 (*)     Creatinine, Ser 1.54 (*)    Albumin 2.4 (*)    AST 884 (*)    ALT 295 (*)    Alkaline Phosphatase 862 (*)    Total Bilirubin 11.2 (*)    GFR, Estimated 48 (*)    Anion gap 20 (*)    All other components within normal limits  LIPASE, BLOOD - Abnormal; Notable for the following components:  Lipase 59 (*)    All other components within normal limits  CBC WITH DIFFERENTIAL/PLATELET - Abnormal; Notable for the following components:   WBC 13.4 (*)    Hemoglobin 12.7 (*)    RDW 17.2 (*)    nRBC 0.7 (*)    Neutro Abs 11.7 (*)    Abs Immature Granulocytes 0.24 (*)    All other components within normal limits  URINALYSIS, ROUTINE W REFLEX MICROSCOPIC - Abnormal; Notable for the following components:   Color, Urine AMBER (*)    APPearance HAZY (*)    Bilirubin Urine SMALL (*)    All other components within normal limits  PROTIME-INR - Abnormal; Notable for the following components:   Prothrombin Time 18.0 (*)    INR 1.6 (*)    All other components within normal limits  AMMONIA - Abnormal; Notable for the following components:   Ammonia 65 (*)    All other components within normal limits  URINE CULTURE  SARS CORONAVIRUS 2 (TAT 6-24 HRS)  CBG MONITORING, ED  TYPE AND SCREEN    EKG None  Radiology US Abdomen Limited RUQ (LIVER/GB)  Result Date: 05/10/2020 CLINICAL DATA:  Right upper quadrant abdominal pain for 1 week. Elevated bilirubin. Jaundice. 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 EXAM: ULTRASOUND ABDOMEN LIMITED RIGHT UPPER QUADRANT COMPARISON:  02/16/2011 CT abdomen/pelvis. FINDINGS: Gallbladder: No shadowing gallstones. Mild gallbladder sludge. Relatively contracted gallbladder without definite gallbladder wall thickening. No pericholecystic fluid or sonographic Murphy's sign. Common bile duct: Diameter: 3 mm Liver: Multiple ill-defined mildly hyperechoic masses scattered throughout the liver, with representative 2.6 x 2.2 x  2.4 cm posterior left liver mass and 3.3 x 2.5 x 2.9 cm anterior right liver mass. Portal vein is patent on color Doppler imaging with normal direction of blood flow towards the liver. Other: Trace perihepatic ascites. IMPRESSION: 1. Multiple ill-defined mildly hyperechoic liver masses scattered throughout the liver, largest approximately 3.3 cm in the right liver, suspicious for metastatic disease. MRI abdomen without and with IV contrast recommended for further characterization. 2. No biliary ductal dilatation. 3. Contracted gallbladder with sludge and no cholelithiasis. Electronically Signed   By: Ilona Sorrel M.D.   On: 04/18/2020 09:34    Procedures .Critical Care Performed by: Lucrezia Starch, MD Authorized by: Lucrezia Starch, MD   Critical care provider statement:    Critical care time (minutes):  43   Critical care was necessary to treat or prevent imminent or life-threatening deterioration of the following conditions:  Hepatic failure and metabolic crisis   Critical care was time spent personally by me on the following activities:  Discussions with consultants, evaluation of patient's response to treatment, examination of patient, ordering and performing treatments and interventions, ordering and review of laboratory studies, ordering and review of radiographic studies, pulse oximetry, re-evaluation of patient's condition, obtaining history from patient or surrogate and review of old charts   (including critical care time)  Medications Ordered in ED Medications  lactulose (CHRONULAC) 10 GM/15ML solution 20 g (has no administration in time range)  calcium gluconate 1 g/ 50 mL sodium chloride IVPB (1,000 mg Intravenous New Bag/Given 05/08/2020 1149)  insulin aspart (novoLOG) injection 5 Units (5 Units Intravenous Given 04/23/2020 1156)    And  dextrose 50 % solution 50 mL (50 mLs Intravenous Given 04/16/2020 1156)  albuterol (VENTOLIN HFA) 108 (90 Base) MCG/ACT inhaler 4 puff (4 puffs  Inhalation Given 05/06/2020 1159)  sodium chloride 0.9 % bolus 1,000 mL (  1,000 mLs Intravenous New Bag/Given 04/17/2020 1146)    ED Course  I have reviewed the triage vital signs and the nursing notes.  Pertinent labs & imaging results that were available during my care of the patient were reviewed by me and considered in my medical decision making (see chart for details).  Clinical Course as of 04/20/2020 1215  Sat Apr 24, 2020  1125 AKI, hyperkalemia, profound transaminitis, will discuss with GI, start fluids, hyper-K meds and update patient [RD]  1159 D/w [facility] - they will come see [RD]  1200 D/w Annamaria Boots with onc, she will come see [RD]    Clinical Course User Index [RD] Lucrezia Starch, MD   MDM Rules/Calculators/A&P                          70 year old male presenting to ER with concern for worsening indigestion, fatigue and confusion.  Recently diagnosed with liver mass, abnormal liver enzymes.  Outpatient referral had been placed to oncology but has not been evaluated yet.  On exam, patient mildly lethargic but in any acute distress.  Vital stable.  Work-up concerning for hyperkalemia, AKI, profound transaminitis and elevation in T bili.  Ultrasound showed normal CBD.  Reviewed with GI, they will consult.  Recommend MRI imaging for further evaluation and consultation with oncology.  Discussed with Dr. Annamaria Boots, she will also evaluate.  Will consult to the hospitalist service for admission.  Gave fluids, insulin, calcium and albuterol for the hyperkalemia.  GI also recommending Lokelma for his hyperammonemia.  Final Clinical Impression(s) / ED Diagnoses Final diagnoses:  Transaminitis  Liver lesion  Total bilirubin, elevated  Hyperammonemia ([place])    Rx / DC Orders ED Discharge Orders    None       Lucrezia Starch, MD 05/05/2020 1215

## 2020-04-24 NOTE — Consult Note (Signed)
[facility] Consult  Referring Provider: ER/Dr.Dykstra Primary Care Physician:  Antony Contras, MD Primary Gastroenterologist: Dr.Medoff([facility])  Reason for Consultation: Abnormal liver enzymes, encephalopathy, liver cancer versus cholangiocarcinoma  HPI: Juan Trevino is a 70 y.o. male presented to the ER with complains of weakness, confusion, overall deconditioning. Most of the history is obtained from the patient's wife Sender Starr who is present at bedside in the ER. It appears that patient was in his usual state of health until after Christmas, when he started complaining of upper abdominal discomfort.  He was noted to have gradual loss of appetite, decreased oral intake, to a point where he was taking less than 200 cal a day as per his wife. He was seen by Dr. Liliane Channel office last week and underwent labs and a CAT scan from 04/22/2019 showed Extensive multifocal bilobar disease throughout, effectively with complete infiltration of the left and central liver. Contour irregularities noted along the right hepatic margin.  Diffuse malignant replacement of the liver and associated adenopathy involving the upper abdominal and lower paraesophageal station. Appearance not entirely specific, but concerning for cholangiocarcinoma versus metastatic disease from an alternative primary source.  Patient has an upcoming appointment with Dr. Jess Barters from [facility] oncology. He was given oxycodone for pain control and today morning he woke up at 4 AM, as per his wife urinated on the floor and appeared confused which prompted her to bring the patient to the ER.  She has noticed that his skin and eyes are yellow in the past 1 week, his stools are pale and that his urine is unusually dark.  Patient has had a colonoscopy in 2015 by Dr. Paulita Fujita, with removal of tubular adenomas. Colonoscopy from 09/2018 for surveillance was reported as normal except for scattered diverticulosis.  His father died of  pancreatic cancer. His mother had colon cancer in his 64s. Patient himself has history of prostate cancer diagnosed in March 2020 for which he has undergone surgery and appears to be in remission.  As per his wife he is not a heavy alcohol user, may drink 4 beers 7 days a week. He quit smoking over 30 years ago. He has no history of IV drug abuse, high risk behavior. No history of liver cancer in the family.     Past Medical History:  Diagnosis Date  . Diverticulitis 2005  . GERD (gastroesophageal reflux disease)   . Hyperlipidemia   . Hypertension   . Prostate cancer ([place])   . Thyroid disease     Past Surgical History:  Procedure Laterality Date  . colonoscopy     . HERNIA REPAIR     umbilical  . hydrocelectomy     . LYMPHADENECTOMY Bilateral 08/14/2018   Procedure: LYMPHADENECTOMY;  Surgeon: Alexis Frock, MD;  Location: [facility];  Service: Urology;  Laterality: Bilateral;  . PROSTATE BIOPSY    . ROBOT ASSISTED LAPAROSCOPIC RADICAL PROSTATECTOMY N/A 08/14/2018   Procedure: XI ROBOTIC ASSISTED LAPAROSCOPIC RADICAL PROSTATECTOMY;  Surgeon: Alexis Frock, MD;  Location: [facility];  Service: Urology;  Laterality: N/A;  3 HRS  . UPPER GI ENDOSCOPY    . vasectomy      Prior to Admission medications   Medication Sig Start Date End Date Taking? Authorizing Provider  amLODipine (NORVASC) 5 MG tablet Take 5 mg by mouth at bedtime.  03/01/18  Yes [provider]  Baclofen 5 MG TABS Take 5 mg by mouth 3 (three) times daily. 04/23/20  Yes [provider]  lisinopril (PRINIVIL,ZESTRIL) 40 MG  tablet Take 40 mg by mouth at bedtime.  03/01/18  Yes [provider]  oxyCODONE (OXY IR/ROXICODONE) 5 MG immediate release tablet Take 5 mg by mouth 4 (four) times daily as needed for pain. 04/21/20  Yes [provider]    Current Facility-Administered Medications  Medication Dose Route Frequency Provider Last Rate Last Admin  . lactulose (CHRONULAC) 10 GM/15ML  solution 20 g  20 g Oral Once Lucrezia Starch, MD       Current Outpatient Medications  Medication Sig Dispense Refill  . amLODipine (NORVASC) 5 MG tablet Take 5 mg by mouth at bedtime.     . Baclofen 5 MG TABS Take 5 mg by mouth 3 (three) times daily.    Marland Kitchen lisinopril (PRINIVIL,ZESTRIL) 40 MG tablet Take 40 mg by mouth at bedtime.     Marland Kitchen oxyCODONE (OXY IR/ROXICODONE) 5 MG immediate release tablet Take 5 mg by mouth 4 (four) times daily as needed for pain.      Allergies as of 05/03/2020  . (No Known Allergies)    Family History  Problem Relation Age of Onset  . Colon cancer Mother   . Hypertension Mother   . Prostate cancer Father   . Lung cancer Father   . Hypertension Father   . Breast cancer Neg Hx   . Pancreatic cancer Neg Hx     Social History   Socioeconomic History  . Marital status: Married    Spouse name: Not on file  . Number of children: 2  . Years of education: Not on file  . Highest education level: Not on file  Occupational History  . Not on file  Tobacco Use  . Smoking status: Former Smoker    Packs/day: 1.50    Years: 18.00    Pack years: 27.00    Types: Cigarettes    Quit date: 01/29/1986    Years since quitting: 34.2  . Smokeless tobacco: Never Used  Vaping Use  . Vaping Use: Never used  Substance and Sexual Activity  . Alcohol use: Yes    Alcohol/week: 8.0 standard drinks    Types: 8 Cans of beer per week  . Drug use: No  . Sexual activity: Yes  Other Topics Concern  . Not on file  Social History Narrative   Caffienated drinks-yes   Seat belt use often-yes   Regular Exercise-yes   Smoke alarm in the home-yes      Resides in [place]. Retired.            Social Determinants of Health   Financial Resource Strain: Not on file  Food Insecurity: Not on file  Transportation Needs: Not on file  Physical Activity: Not on file  Stress: Not on file  Social Connections: Not on file  Intimate Partner Violence: Not on file     Review of Systems: Positive for: GI: Described in detail in HPI.    Gen: anorexia, fatigue, weakness, malaise, denies any fever, chills, rigors, night sweats,  involuntary weight loss, and sleep disorder CV: Denies chest pain, angina, palpitations, syncope, orthopnea, PND, peripheral edema, and claudication. Resp: Denies dyspnea, cough, sputum, wheezing, coughing up blood. GU : Denies urinary burning, blood in urine, urinary frequency, urinary hesitancy, nocturnal urination, and urinary incontinence. MS: Denies joint pain or swelling.  Denies muscle weakness, cramps, atrophy.  Derm: Denies rash, itching, oral ulcerations, hives, unhealing ulcers.  Psych: confusion ,denies depression, anxiety, memory loss, suicidal ideation or hallucinations.  Heme: Denies bruising, bleeding, and enlarged lymph  nodes. Neuro:  Denies any headaches, dizziness, paresthesias. Endo:  Denies any problems with DM, thyroid, adrenal function.  Physical Exam: Vital signs in last 24 hours: Temp:  [98.1 F (36.7 C)-98.4 F (36.9 C)] 98.1 F (36.7 C) (01/15 0749) Pulse Rate:  [83-91] 85 (01/15 1145) Resp:  [16-24] 24 (01/15 1145) BP: (107-170)/(47-107) 141/92 (01/15 1145) SpO2:  [95 %-99 %] 99 % (01/15 1145) Weight:  [93 kg] 93 kg (01/15 0701)    General:   Alert,  Well-developed, well-nourished, pleasant and cooperative in NAD Head:  Normocephalic and atraumatic. Eyes: Deep icterus Ears:  Normal auditory acuity. Nose:  No deformity, discharge,  or lesions. Mouth:  No deformity or lesions.  Oropharynx pink & moist. Neck:  Supple; no masses or thyromegaly. Lungs:  Clear throughout to auscultation.   No wheezes, crackles, or rhonchi. No acute distress. Heart:  Regular rate and rhythm; no murmurs, clicks, rubs,  or gallops. Extremities:  Without clubbing or edema. Neurologic:  Alert and  oriented x4;  grossly normal neurologically.  Asterixis present Skin:  Intact without significant lesions or  rashes. Psych:  Alert and cooperative. Normal mood and affect. Abdomen:  Soft, mild right upper quadrant tenderness and nondistended.  Normal bowel sounds, without guarding, and without rebound.         Lab Results: Recent Labs    04/19/2020 0900  WBC 13.4*  HGB 12.7*  HCT 39.9  PLT 248   BMET Recent Labs    04/19/2020 0900  NA 133*  K 6.7*  CL 96*  CO2 17*  GLUCOSE 113*  BUN 96*  CREATININE 1.54*  CALCIUM 10.2   LFT Recent Labs    04/16/2020 0900  PROT 6.6  ALBUMIN 2.4*  AST 884*  ALT 295*  ALKPHOS 862*  BILITOT 11.2*   PT/INR Recent Labs    05/04/2020 0900  LABPROT 18.0*  INR 1.6*    Studies/Results: US Abdomen Limited RUQ (LIVER/GB)  Result Date: 04/21/2020 CLINICAL DATA:  Right upper quadrant abdominal pain for 1 week. Elevated bilirubin. Jaundice. 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 EXAM: ULTRASOUND ABDOMEN LIMITED RIGHT UPPER QUADRANT COMPARISON:  02/16/2011 CT abdomen/pelvis. FINDINGS: Gallbladder: No shadowing gallstones. Mild gallbladder sludge. Relatively contracted gallbladder without definite gallbladder wall thickening. No pericholecystic fluid or sonographic Murphy's sign. Common bile duct: Diameter: 3 mm Liver: Multiple ill-defined mildly hyperechoic masses scattered throughout the liver, with representative 2.6 x 2.2 x 2.4 cm posterior left liver mass and 3.3 x 2.5 x 2.9 cm anterior right liver mass. Portal vein is patent on color Doppler imaging with normal direction of blood flow towards the liver. Other: Trace perihepatic ascites. IMPRESSION: 1. Multiple ill-defined mildly hyperechoic liver masses scattered throughout the liver, largest approximately 3.3 cm in the right liver, suspicious for metastatic disease. MRI abdomen without and with IV contrast recommended for further characterization. 2. No biliary ductal dilatation. 3. Contracted gallbladder with sludge and no cholelithiasis. Electronically  Signed   By: Ilona Sorrel M.D.   On: 05/03/2020 09:34    Impression: Multiple liver masses scattered throughout the liver suspicious for metastatic disease, possible cholangiocarcinoma CBD normal at 3 mm Patent portal vein T bili 11.2/AST 88/ALT 295/ALP 862  Renal failure, BUN 96, creatinine 1.54, GFR 48 Hyperkalemia, potassium 6.7 Acidosis bicarb 17  Mild confusion and asterixis compatible with encephalopathy Mild coagulopathy, PT 18, INR 1.6  Plan: As per patient's wife, he was recommended home hospice and comfort care after being seen by Dr.  Medoff's office and has a pending oncology evaluation as an outpatient at [facility] on 04/27/2020. Findings concerning for cholangiocarcinoma, metastatic liver cancer versus primary liver cancer Poor prognosis Due to encephalopathy we will start patient on lactulose Will order alpha-fetoprotein and CA 19-9. Recommend oncology evaluation, as wife understands that this is likely incurable and patient may benefit from comfort care. Further management as per oncology. Please recall GI if needed.   LOS: 0 days   Ronnette Juniper, MD  04/30/2020, 12:27 PM

## 2020-04-25 DIAGNOSIS — N179 Acute kidney failure, unspecified: Secondary | ICD-10-CM | POA: Diagnosis not present

## 2020-04-25 DIAGNOSIS — I10 Essential (primary) hypertension: Secondary | ICD-10-CM | POA: Diagnosis not present

## 2020-04-25 DIAGNOSIS — C787 Secondary malignant neoplasm of liver and intrahepatic bile duct: Secondary | ICD-10-CM

## 2020-04-25 DIAGNOSIS — C61 Malignant neoplasm of prostate: Secondary | ICD-10-CM | POA: Diagnosis not present

## 2020-04-25 DIAGNOSIS — K769 Liver disease, unspecified: Secondary | ICD-10-CM | POA: Diagnosis not present

## 2020-04-25 DIAGNOSIS — K7201 Acute and subacute hepatic failure with coma: Secondary | ICD-10-CM

## 2020-04-25 DIAGNOSIS — Z515 Encounter for palliative care: Secondary | ICD-10-CM | POA: Diagnosis not present

## 2020-04-25 DIAGNOSIS — R410 Disorientation, unspecified: Secondary | ICD-10-CM | POA: Diagnosis not present

## 2020-04-25 DIAGNOSIS — E669 Obesity, unspecified: Secondary | ICD-10-CM | POA: Diagnosis not present

## 2020-04-25 LAB — AFP TUMOR MARKER: AFP, Serum, Tumor Marker: 4.5 ng/mL (ref 0.0–8.3)

## 2020-04-25 LAB — CANCER ANTIGEN 19-9: CA 19-9: 152 U/mL — ABNORMAL HIGH (ref 0–35)

## 2020-04-25 MED ORDER — SODIUM CHLORIDE 0.9 % IV SOLN
12.5000 mg | Freq: Four times a day (QID) | INTRAVENOUS | Status: DC | PRN
  Filled 2020-04-25 (×3): qty 0.5

## 2020-04-26 LAB — URINE CULTURE: Culture: >=100000 — AB

## 2020-05-11 NOTE — Progress Notes (Signed)
Nutrition Brief Note  Chart reviewed. Pt now transitioning to comfort care.  No further nutrition interventions warranted at this time.  Please re-consult as needed.   Fatumata Kashani W, RD, LDN, CDCES Registered Dietitian II Certified Diabetes Care and Education Specialist Please refer to [facility] for RD and/or RD on-call/weekend/after hours pager  

## 2020-05-11 NOTE — Progress Notes (Signed)
PROGRESS NOTE  Juan Trevino NIO:[id number] DOB: 18-Sep-1950 DOA: 05/09/2020 PCP: Antony Contras, MD   LOS: 1 day   Brief narrative:  Juan Trevino is a 70 y.o. male with medical history significant of  hypertension, hyperlipidemia, prostate cancer status post radical prostatectomy presented to the hospital with with increasing abdominal pain, loss of appetite for 2 weeks with diminished urinary output. He went to see gastroenterologist at [facility] who ordered a CAT scan on 04/21/2020 which showed diffuse malignant replacement of the liver associated with adenopathy involving upper abdominal and lower esophageal area.  2 days prior to presentation his symptoms got worse with excruciating pain and difficulty with oral intake.  Also experienced worsening jaundice.  In the ED, patient was noted to have significant acute kidney injury with potassium 6.7, bicarb 17 and creatinine 1.5 compared to baseline 0.8.  INR was elevated at 1.6 AST 800 ALT more than 290, bilirubin 11.  Oncology and GI both were consulted consulted.  Assessment/Plan:  Principal Problem:   AKI (acute kidney injury) ([place]) Active Problems:   Essential hypertension, benign   Obesity (BMI 30.0-34.9)   Malignant neoplasm of prostate ([facility])   Total bilirubin, elevated   Transaminitis  Severe hyperkalemia Thought to be secondary to worsening renal failure.  Latest potassium of 7.2.  Patient initially received insulin, glucose, calcium gluconate and bicarb.  Patient had refused Foley catheter placement. Patient did not want any hemodialysis or other artificial breathing to prolong life.  Patient also received Lokelma and lactulose.  Intractable hiccups, add thorazine.   Acute non-gap and anion gap of medical acidosis -From severe dehydration/ATN, as well as starvation  Extensive metastatic liver disease/cholangiocarcinoma. -Did have extensive disease.  Admitting provider spoke with the patient's wife including oncology and at this  time focuses on palliative care and hospice.  Focus on comfort care medications.  Acute liver failure, acute metabolic encephalopathy -Elevated INR/coagulopathy, encephalopathy and profound bilirubinemia and transaminitis, and elevated ammonia.  Patient initially received symptomatic management with vitamin K, lactulose.  Focus currently is on comfort care.  DVT prophylaxis: comfort care   Code Status: DNR  Family Communication: Spoke with the patient's wife at bedside.  She wishes her husband to be as comfortable as possible.  Extremely poor prognosis.  Status is: Inpatient  Remains inpatient appropriate because:IV treatments appropriate due to intensity of illness or inability to take PO, Inpatient level of care appropriate due to severity of illness and Initiation of comfort care, hospice   Dispo: The patient is from: Home              Anticipated d/c is to: Likely in-hospital death/hospice              Anticipated d/c date is: 2 days              Patient currently is not medically stable to d/c.    Consultants:  Oncology, GI  Procedures:  None  Anti-infectives:  . None  Anti-infectives (From admission, onward)   None       Subjective: Today, patient was seen and examined at bedside.  Patient appears to be in discomfort with constant hiccuping.  No meaningful response to verbal command.  Objective: Vitals:   05/10/2020 1742 05/01/2020 0632  BP: (!) 132/45 120/62  Pulse: 88 84  Resp: 17 16  Temp: 98 F (36.7 C) 99 F (37.2 C)  SpO2: 98% 93%    Intake/Output Summary (Last 24 hours) at 05/05/2020 8182 Last data filed at 04/23/2020  1708 Gross per 24 hour  Intake 1139 ml  Output -  Net 1139 ml   Filed Weights   04/18/2020 0701  Weight: 93 kg   Body mass index is 27.8 kg/m.   Physical Exam:  GENERAL: Patient is constantly hiccuping, minimally responsive, does not want to be bothered, closing his eyes, HENT: Icterus noted, pupils equally reactive to  light. Oral mucosa is dry NECK: is supple, no gross swelling noted. CHEST: Clear to auscultation. No crackles or wheezes.  Diminished breath sounds bilaterally. CVS: S1 and S2 heard, no murmur. Regular rate and rhythm.  ABDOMEN: Distended abdomen , large mass in the epigastric area .EXTREMITIES: No edema. CNS: No meaningful response, constantly hiccuping, SKIN: warm and dry without rashes.  Data Review: I have personally reviewed the following laboratory data and studies,  CBC: Recent Labs  Lab 04/14/2020 0900  WBC 13.4*  NEUTROABS 11.7*  HGB 12.7*  HCT 39.9  MCV 90.5  PLT [id number]   Basic Metabolic Panel: Recent Labs  Lab 04/22/2020 0900 04/22/2020 1445  NA 133* 133*  K 6.7* 7.2*  CL 96* 100  CO2 17* 15*  GLUCOSE 113* 105*  BUN 96* 100*  CREATININE 1.54* 1.33*  CALCIUM 10.2 9.9   Liver Function Tests: Recent Labs  Lab 05/10/2020 0900 05/09/2020 1445  AST 884* 946*  ALT 295* 286*  ALKPHOS 862* 779*  BILITOT 11.2* 10.7*  PROT 6.6 5.8*  ALBUMIN 2.4* 2.2*   Recent Labs  Lab 05/07/2020 0900  LIPASE 59*   Recent Labs  Lab 04/21/2020 0900  AMMONIA 65*   Cardiac Enzymes: No results for input(s): CKTOTAL, CKMB, CKMBINDEX, TROPONINI in the last 168 hours. BNP (last 3 results) No results for input(s): BNP in the last 8760 hours.  ProBNP (last 3 results) No results for input(s): PROBNP in the last 8760 hours.  CBG: Recent Labs  Lab 04/29/2020 0905 04/21/2020 1236  GLUCAP 95 122*   Recent Results (from the past 240 hour(s))  SARS CORONAVIRUS 2 (TAT 6-24 HRS) Nasopharyngeal Nasopharyngeal Swab     Status: None   Collection Time: 04/21/2020  9:00 AM   Specimen: Nasopharyngeal Swab  Result Value Ref Range Status   SARS Coronavirus 2 NEGATIVE NEGATIVE Final    Comment: (NOTE) SARS-CoV-2 target nucleic acids are NOT DETECTED.  The SARS-CoV-2 RNA is generally detectable in upper and lower respiratory specimens during the acute phase of infection. Negative results do not  preclude SARS-CoV-2 infection, do not rule out co-infections with other pathogens, and should not be used as the sole basis for treatment or other patient management decisions. Negative results must be combined with clinical observations, patient history, and epidemiological information. The expected result is Negative.  Fact Sheet for Patients: SugarRoll.be  Fact Sheet for Healthcare Providers: https://www.woods-mathews.com/  This test is not yet approved or cleared by the [place] FDA and  has been authorized for detection and/or diagnosis of SARS-CoV-2 by FDA under an Emergency Use Authorization (EUA). This EUA will remain  in effect (meaning this test can be used) for the duration of the COVID-19 declaration under Se ction 564(b)(1) of the Act, 21 U.S.C. section 360bbb-3(b)(1), unless the authorization is terminated or revoked sooner.  Performed at [facility], [place] [address], [place], [place] [address]      Studies: MR ABDOMEN LIMITED  Result Date: 04/11/2020 CLINICAL DATA:  New diagnosis of hepatobiliary cancer reportedly last week at an outside facility. Patient presents with jaundice, altered mental status, decreased appetite and  dehydration. EXAM: MRI ABDOMEN WITHOUT CONTRAST TECHNIQUE: Multiplanar multisequence MR imaging was performed without the administration of intravenous contrast. Examination discontinued due to patient inability to remain still and follow instructions. Axial and coronal T2 sequences were obtained. No T1 imaging, chemical shift imaging or diffusion-weighted sequences obtained. COMPARISON:  05/10/2020 abdominal sonogram. 02/16/2011 CT abdomen/pelvis. FINDINGS: Very limited motion degraded noncontrast MRI study lacking multiple basic sequences as above. Lower chest: No acute abnormality at the lung bases. Hepatobiliary: Hepatomegaly. Innumerable (> than 20) mildly T2 hyperintense liver masses scattered  throughout the liver, for example measuring 3.4 x 2.4 cm in the segment 4A left liver lobe (series 3/image 14), 4.2 x 2.4 cm in the segment 4B left liver lobe (series 3/image 32) and 2.4 x 2.3 cm in the central segment 5 right liver (series 3/image 27). In addition, there is abnormal T2 parenchymal signal intensity replacing most of the liver with geographic areas of sparing in the right liver, suspicious for widely infiltrative neoplasm. Nondistended gallbladder. Mild diffuse gallbladder wall thickening. No cholelithiasis. No appreciable intrahepatic biliary ductal dilatation. Common bile duct diameter 2 mm. No evidence of choledocholithiasis or biliary strictures. Pancreas: No pancreatic mass or duct dilation.  No pancreas divisum. Spleen: Normal size. No mass. Adrenals/Urinary Tract: Normal adrenals. No hydronephrosis. Normal kidneys with no renal mass. Stomach/Bowel: Small to moderate hiatal hernia. Otherwise normal nondistended stomach. Visualized small and large bowel is normal caliber, with no bowel wall thickening. Colonic diverticulosis. Vascular/Lymphatic: Normal caliber abdominal aorta. Mildly enlarged 1.0 cm right pericardiophrenic lymph node (series 3/image 13). Enlarged 1.8 cm porta hepatis node (series 3/image 28). Mildly enlarged 1.4 cm portacaval node (series 3/image 29). Enlarged 1.3 cm high left para-aortic node (series 3/image 28). Other: Trace perihepatic ascites.  No focal fluid collection. Musculoskeletal: No aggressive appearing focal osseous lesions. IMPRESSION: 1. Very limited motion degraded noncontrast MRI study, see comments. 2. Innumerable (> than 20) mildly T2 hyperintense liver masses scattered throughout the liver, compatible with widespread liver metastases. 3. Abnormal liver parenchymal T2 signal intensity replacing most of the liver with geographic areas of sparing in the right liver, suspicious for widely infiltrative liver malignancy. Differential includes hepatocellular  carcinoma or cholangiocarcinoma, favoring hepatocellular carcinoma. 4. Mild porta hepatis, portacaval and high left para-aortic lymphadenopathy, concerning for metastatic disease. 5. Trace perihepatic ascites. 6. Small to moderate hiatal hernia. 7. Colonic diverticulosis. Electronically Signed   By: Ilona Sorrel M.D.   On: 04/15/2020 14:00   US Abdomen Limited RUQ (LIVER/GB)  Result Date: 04/30/2020 CLINICAL DATA:  Right upper quadrant abdominal pain for 1 week. Elevated bilirubin. Jaundice. 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 EXAM: ULTRASOUND ABDOMEN LIMITED RIGHT UPPER QUADRANT COMPARISON:  02/16/2011 CT abdomen/pelvis. FINDINGS: Gallbladder: No shadowing gallstones. Mild gallbladder sludge. Relatively contracted gallbladder without definite gallbladder wall thickening. No pericholecystic fluid or sonographic Murphy's sign. Common bile duct: Diameter: 3 mm Liver: Multiple ill-defined mildly hyperechoic masses scattered throughout the liver, with representative 2.6 x 2.2 x 2.4 cm posterior left liver mass and 3.3 x 2.5 x 2.9 cm anterior right liver mass. Portal vein is patent on color Doppler imaging with normal direction of blood flow towards the liver. Other: Trace perihepatic ascites. IMPRESSION: 1. Multiple ill-defined mildly hyperechoic liver masses scattered throughout the liver, largest approximately 3.3 cm in the right liver, suspicious for metastatic disease. MRI abdomen without and with IV contrast recommended for further characterization. 2. No biliary ductal dilatation. 3. Contracted gallbladder with sludge and no cholelithiasis. Electronically Signed  By: Ilona Sorrel M.D.   On: 04/12/2020 09:34      Flora Lipps, MD  [facility] 05-19-20  If 7PM-7AM, please contact night-coverage

## 2020-05-11 NOTE — Progress Notes (Signed)
Morphine 70 ml of 1 mg/ml infusion wasted in [place] with Adela Ports, RN.

## 2020-05-11 NOTE — Progress Notes (Signed)
Daily Progress Note   Patient Name: Juan Trevino       Date: 04/27/2020 DOB: Nov 15, 1950  Age: 70 y.o. MRN#: [id number] Attending Physician: Flora Lipps, MD Primary Care Physician: Antony Contras, MD Admit Date: 05/04/2020  Reason for Consultation/Follow-up: Terminal Care  Subjective: Juan Trevino is actively dying.  I saw and examined him this morning.  His wife is at bedside.  She reports that he has been comfortable overnight since starting morphine infusion.  Discussed continued plan for comfort moving forward.  Length of Stay: 1  Current Medications: Scheduled Meds:  . scopolamine  1 patch Transdermal Q72H    Continuous Infusions: . chlorproMAZINE (THORAZINE) IV    . morphine 1 mg/hr (04/16/2020 2318)    PRN Meds: acetaminophen **OR** acetaminophen, antiseptic oral rinse, chlorproMAZINE (THORAZINE) IV, glycopyrrolate **OR** glycopyrrolate **OR** glycopyrrolate, haloperidol **OR** haloperidol **OR** haloperidol lactate, HYDROmorphone (DILAUDID) injection, LORazepam **OR** LORazepam **OR** LORazepam, morphine, ondansetron **OR** ondansetron (ZOFRAN) IV, polyvinyl alcohol  Physical Exam      General: Does not arouseduring my exam HEENT: No bruits, no goiter, no JVD Heart: Regular rate and rhythm. No murmur appreciated. Lungs: Good air movement, clear Abdomen: Soft, nontender, nondistended, positive bowel sounds.  Ext: No significant edema  Vital Signs: BP 120/62 (BP Location: Right Arm)   Pulse 84   Temp 99 F (37.2 C) (Oral)   Resp 16   Ht 6' (1.829 m)   Wt 93 kg   SpO2 93%   BMI 27.80 kg/m  SpO2: SpO2: 93 % O2 Device: O2 Device: Room Air O2 Flow Rate:    Intake/output summary:   Intake/Output Summary (Last 24 hours) at 05/05/2020 1110 Last data filed at 04/26/2020  1708 Gross per 24 hour  Intake 1139 ml  Output -  Net 1139 ml   LBM: Last BM Date: 04/13/2020 Baseline Weight: Weight: 93 kg Most recent weight: Weight: 93 kg       Palliative Assessment/Data:    Flowsheet Rows   Flowsheet Row Most Recent Value  Intake Tab   Referral Department Hospitalist  Unit at Time of Referral ER  Palliative Care Primary Diagnosis Cancer  Date Notified 05/06/2020  Palliative Care Type New Palliative care  Reason for referral End of Life Care Assistance  Date of Admission 04/26/2020  Date first seen by Palliative Care 05/08/2020  #  of days Palliative referral response time 0 Day(s)  # of days IP prior to Palliative referral 0  Clinical Assessment   Palliative Performance Scale Score 20%  Psychosocial & Spiritual Assessment   Palliative Care Outcomes   Patient/Family meeting held? Yes  Who was at the meeting? Wife      Patient Active Problem List   Diagnosis Date Noted  . AKI (acute kidney injury) ([place]) 04/16/2020  . Hyperammonemia ([place])   . Total bilirubin, elevated   . Transaminitis   . Liver lesion   . Prostate cancer ([place]) 08/14/2018  . Malignant neoplasm of prostate ([place]) 06/18/2018  . Obesity (BMI 30.0-34.9) 11/06/2012  . Other abnormal glucose 11/06/2011  . GERD (gastroesophageal reflux disease) 11/06/2011  . Essential hypertension, benign 11/06/2011  . Routine general medical examination at a health care facility 11/06/2011    Palliative Care Assessment & Plan   Patient Profile: 70 y.o. male  with past medical history of hypertension, hyperlipidemia, prostate cancer status post radical prostatectomy who presented to the ED with abdominal pain, loss of appetite, and severe dehydration.  He had developed symptoms of abdominal pain with nausea and fullness over the past few weeks and had CAT scan on 04/21/2020 that showed diffuse malignant replacement of the liver with adenopathy. Work-up in the ED revealed significant lactic acidosis and  hyperkalemia that continued to progress despite medical interventions.  Severity of his situation was discussed with patient's wife by the hospitalist and after consultation with nephrology, GI, and oncology, plan was established to admit on 04/14/2020 with plan for comfort moving forward.  Palliative consulted for family support, goals of care discussion, and terminal care symptom management recommendations.  Recommendations/Plan:  Full comfort  Appears comfortable on my exam this AM.  Continue current care.  Discussed care plan and provided anticipatory guidance to wife at bedside.  Goals of Care and Additional Recommendations:  Limitations on Scope of Treatment: Full Comfort Care  Code Status:    Code Status Orders  (From admission, onward)         Start     Ordered   04/14/2020 1705  Do not attempt resuscitation (DNR)  Continuous       Question Answer Comment  In the event of cardiac or respiratory ARREST Do not call a "code blue"   In the event of cardiac or respiratory ARREST Do not perform Intubation, CPR, defibrillation or ACLS   In the event of cardiac or respiratory ARREST Use medication by any route, position, wound care, and other measures to relive pain and suffering. May use oxygen, suction and manual treatment of airway obstruction as needed for comfort.      05/07/2020 1706        Code Status History    Date Active Date Inactive Code Status Order ID Comments User Context   05/09/2020 1327 04/22/2020 1706 DNR [id number]  Lequita Halt, MD ED   08/14/2018 1720 08/15/2018 1933 Full Code [id number]  Lattie Corns Inpatient   Advance Care Planning Activity       Prognosis:   Hours - Days  Discharge Planning:  Anticipated Hospital Death  Care plan was discussed with wife, bedside RN  Thank you for allowing the Palliative Medicine Team to assist in the care of this patient.   Time In: 0550 Time Out: 610 Total Time 20 Prolonged Time Billed No      Greater  than 50%  of this time was spent counseling and coordinating care related to  the above assessment and plan.  Micheline Rough, MD  Please contact Palliative Medicine Team phone at [phone] for questions and concerns.

## 2020-05-11 NOTE — Consult Note (Signed)
Consultation Note Date: 05/21/20   Patient Name: Juan Trevino  DOB: 13-Jul-1950  MRN: [id number]  Age / Sex: 70 y.o., male  PCP: Antony Contras, MD Referring Physician: Dr. Wynetta Fines  Reason for Consultation: Establishing goals of care and Terminal Care  HPI/Patient Profile: 70 y.o. male  with past medical history of hypertension, hyperlipidemia, prostate cancer status post radical prostatectomy who presented to the ED with abdominal pain, loss of appetite, and severe dehydration.  He had developed symptoms of abdominal pain with nausea and fullness over the past few weeks and had CAT scan on 04/21/2020 that showed diffuse malignant replacement of the liver with adenopathy. Work-up in the ED revealed significant lactic acidosis and hyperkalemia that continued to progress despite medical interventions.  Severity of his situation was discussed with patient's wife by the hospitalist and after consultation with nephrology, GI, and oncology, plan was established to admit on 05/07/2020 with plan for comfort moving forward.  Palliative consulted for family support, goals of care discussion, and terminal care symptom management recommendations.  Clinical Assessment and Goals of Care: His wife felt that a focus on comfortPlan of care consult received.  Discussed in detail with Dr. Roosevelt Locks.  I saw Juan Trevino in the ED in conjunction with Dr. Roosevelt Locks and discussed case in detail with his wife in the hallway.  We reviewed his symptoms and findings on admission and discussed continued worsening despite medical interventions and concern that he is rapidly approaching end-of-life.  His wife relays understanding that he is dying and she wants to ensure that he does not suffer.  We discussed plan for comfort care and medications to ameliorate symptoms including pain, anxiety, shortness of breath, nausea, agitation, and excess secretions.  She  is familiar with end-of-life care from being with her father when he died several years ago.  She is particularly concerned about excess secretions as she talks about her dad developing a pretty significant "death rattle."  SUMMARY OF RECOMMENDATIONS   -DNR/DNI -Full comfort care moving forward -Pain/shortness of breath: Primary hospitalist is already ordered for morphine infusion.  I added on bolus dosing for use as needed for symptoms as they arise. -Anxiety: Ativan as needed -Agitation: Haldol as needed -Excess secretions: Scopolamine patch ordered.  I also ordered for Robinul as needed. -I expect his prognosis at this point is likely hours to days.  I will plan to check in on him in the morning, assuming he has survives the night. -Anticipatory guidance provided.  His wife's biggest question was regarding neck steps when the time comes that he does pass.  She would like to work with [employer], fashion/clothing.  Code Status/Advance Care Planning:  DNR   Symptom Management:  As above Palliative Prophylaxis:   Frequent Pain Assessment  Additional Recommendations (Limitations, Scope, Preferences):  Full Comfort Care  Psycho-social/Spiritual:   Desire for further Chaplaincy support: Offered.  Additional Recommendations: Grief/Bereavement Support  Prognosis:   Hours - Days  Discharge Planning: Anticipated Hospital Death      Primary Diagnoses: Present on  Admission: . AKI (acute kidney injury) ([place]) . Essential hypertension, benign . Obesity (BMI 30.0-34.9) . Malignant neoplasm of prostate ([place]) . Total bilirubin, elevated . Transaminitis   I have reviewed the medical record, interviewed the patient and family, and examined the patient. The following aspects are pertinent.  Past Medical History:  Diagnosis Date  . Diverticulitis 2005  . GERD (gastroesophageal reflux disease)   . Hyperlipidemia   . Hypertension   . Prostate cancer ([place])   . Thyroid disease    Social  History   Socioeconomic History  . Marital status: Married    Spouse name: Not on file  . Number of children: 2  . Years of education: Not on file  . Highest education level: Not on file  Occupational History  . Not on file  Tobacco Use  . Smoking status: Former Smoker    Packs/day: 1.50    Years: 18.00    Pack years: 27.00    Types: Cigarettes    Quit date: 01/29/1986    Years since quitting: 34.2  . Smokeless tobacco: Never Used  Vaping Use  . Vaping Use: Never used  Substance and Sexual Activity  . Alcohol use: Yes    Alcohol/week: 8.0 standard drinks    Types: 8 Cans of beer per week  . Drug use: No  . Sexual activity: Yes  Other Topics Concern  . Not on file  Social History Narrative   Caffienated drinks-yes   Seat belt use often-yes   Regular Exercise-yes   Smoke alarm in the home-yes      Resides in [place]. Retired.            Social Determinants of Health   Financial Resource Strain: Not on file  Food Insecurity: Not on file  Transportation Needs: Not on file  Physical Activity: Not on file  Stress: Not on file  Social Connections: Not on file   Family History  Problem Relation Age of Onset  . Colon cancer Mother   . Hypertension Mother   . Prostate cancer Father   . Lung cancer Father   . Hypertension Father   . Breast cancer Neg Hx   . Pancreatic cancer Neg Hx    Scheduled Meds: . scopolamine  1 patch Transdermal Q72H   Continuous Infusions: . morphine 1 mg/hr (04/26/2020 2318)   PRN Meds:.acetaminophen **OR** acetaminophen, antiseptic oral rinse, glycopyrrolate **OR** glycopyrrolate **OR** glycopyrrolate, haloperidol **OR** haloperidol **OR** haloperidol lactate, HYDROmorphone (DILAUDID) injection, LORazepam **OR** LORazepam **OR** LORazepam, morphine, ondansetron **OR** ondansetron (ZOFRAN) IV, polyvinyl alcohol Medications Prior to Admission:  Prior to Admission medications   Medication Sig Start Date End Date Taking? Authorizing  Provider  amLODipine (NORVASC) 5 MG tablet Take 5 mg by mouth at bedtime.  03/01/18  Yes [provider]  Baclofen 5 MG TABS Take 5 mg by mouth 3 (three) times daily. 04/23/20  Yes [provider]  lisinopril (PRINIVIL,ZESTRIL) 40 MG tablet Take 40 mg by mouth at bedtime.  03/01/18  Yes [provider]  oxyCODONE (OXY IR/ROXICODONE) 5 MG immediate release tablet Take 5 mg by mouth 4 (four) times daily as needed for pain. 04/21/20  Yes [provider]   No Known Allergies Review of Systems Unable to obtain  Physical Exam General: Sleeping, ill-appearing male lying in bed HEENT: No bruits, no goiter, no JVD Heart: Regular rate and rhythm. No murmur appreciated. Lungs: Fair air movement, no apnea noted Abdomen: distended Ext: Some edema Skin: Warm and dry  Vital  Signs: BP 120/62 (BP Location: Right Arm)   Pulse 84   Temp 99 F (37.2 C) (Oral)   Resp 16   Ht 6' (1.829 m)   Wt 93 kg   SpO2 93%   BMI 27.80 kg/m  Pain Scale: 0-10   Pain Score: Asleep   SpO2: SpO2: 93 % O2 Device:SpO2: 93 % O2 Flow Rate: .   IO: Intake/output summary:   Intake/Output Summary (Last 24 hours) at 04/30/2020 2423 Last data filed at 04/26/20 1708 Gross per 24 hour  Intake 1139 ml  Output --  Net 1139 ml    LBM: Last BM Date: 2020/04/26 Baseline Weight: Weight: 93 kg Most recent weight: Weight: 93 kg     Palliative Assessment/Data:   Flowsheet Rows   Flowsheet Row Most Recent Value  Intake Tab   Referral Department Hospitalist  Unit at Time of Referral ER  Palliative Care Primary Diagnosis Cancer  Date Notified 04/26/2020  Palliative Care Type New Palliative care  Reason for referral End of Life Care Assistance  Date of Admission 2020-04-26  Date first seen by Palliative Care 26-Apr-2020  # of days Palliative referral response time 0 Day(s)  # of days IP prior to Palliative referral 0  Clinical Assessment   Palliative Performance Scale Score 20%   Psychosocial & Spiritual Assessment   Palliative Care Outcomes   Patient/Family meeting held? Yes  Who was at the meeting? Wife      Time In: 1615 Time Out: 1710 Time Total: 81 Greater than 50%  of this time was spent counseling and coordinating care related to the above assessment and plan.  Signed by: Micheline Rough, MD   Please contact Palliative Medicine Team phone at [phone] for questions and concerns.  For individual provider: See Shea Evans

## 2020-05-11 NOTE — Progress Notes (Signed)
This RN was called to room at 1610 by patients wife. He was with out heartbeat or respirations, confirmed with  Mardi Mainland RN.

## 2020-05-11 NOTE — Death Summary Note (Signed)
Death Summary  Per [place]ag[place] LKG:[id number] DOB: 03/14/1951 DOA: 05/11/20  PCP: Antony Contras, MD   Admit date: 05/11/2020 Date of Death: 2020-05-12  Final Diagnoses:  Principal Problem:   AKI (acute kidney injury) ([place]) Active Problems:   Essential hypertension, benign   Obesity (BMI 30.0-34.9)   Malignant neoplasm of prostate ([facility])   Total bilirubin, elevated   Transaminitis   History of present illness:  Juan Trevino a 70 y.o.malewith medical history significant of hypertension, hyperlipidemia, prostate cancer status post radical prostatectomy presented to the hospital with with increasing abdominal pain, loss of appetite for 2 weeks with diminished urinary output. He went to see gastroenterologist at [facility] who ordered a CAT scan on 04/21/2020 which showed diffuse malignant replacement of the liver associated with adenopathy involving upper abdominal and lower esophageal area.  2 days prior to presentation his symptoms got worse with excruciating pain and difficulty with oral intake.  Also experienced worsening jaundice.  In the ED, patient was noted to have significant acute kidney injury with potassium 6.7, bicarb 17 and creatinine 1.5 compared to baseline 0.8. INR was elevated at 1.6 AST 800 ALT more than 290, bilirubin 11.Oncology and GI both were consulted .  Hospital Course:   Severe hyperkalemia Thought to be secondary to worsening renal failure.  Latest potassium of 7.2.  Patient initially received insulin, glucose, calcium gluconate and bicarb.  Patient had refused Foley catheter placement. Patient did not want any hemodialysis or other artificial breathing to prolong life.  Patient also received Lokelma and lactulose.  Intractable hiccups, was given thorazine.   Acute non-gapand anion gap of medical acidosis -From severe dehydration/ATN, as well as starvation  Extensive metastatic liver disease/cholangiocarcinoma. -Did have extensive disease.  Admitting  provider spoke with the patient's wife including oncology and comfort care was pursued.  Acute liver failure, acute metabolic encephalopathy -Elevated INR/coagulopathy, encephalopathy and profound bilirubinemia and transaminitis,and elevated ammonia.  Patient initially received symptomatic management with vitamin K, lactulose.  Focus was subsequently on comfort care.  Patient continued to decline and subsequently was declared dead by nursing staff.  Time of death: 07/28/1608  Signed:  Concha Sudol  [facility] 05-12-20, 6:13 PM

## 2020-05-11 DEATH — deceased

## 2022-02-15 IMAGING — MR MR ABDOMEN LIMITED W/O CM
2 series · 48 of 48 positions shown · non-contrast
Comparison: 04/24/2020 abdominal sonogram. 02/16/2011 CT
abdomen/pelvis.

CLINICAL DATA: New diagnosis of hepatobiliary cancer reportedly
last week at an outside facility. Patient presents with jaundice,
altered mental status, decreased appetite and dehydration.

EXAM:
MRI ABDOMEN WITHOUT CONTRAST
TECHNIQUE: Multiplanar multisequence MR imaging was performed without the
administration of intravenous contrast. Examination discontinued due
to patient inability to remain still and follow instructions. Axial
and coronal T2 sequences were obtained. No T1 imaging, chemical
shift imaging or diffusion-weighted sequences obtained.

[Series 3: T2 fat-sat · axial · 6.0mm · 1.17mm/px · z∈[-208,+131]mm · 26 of 48 slices shown]
[im 1/48]
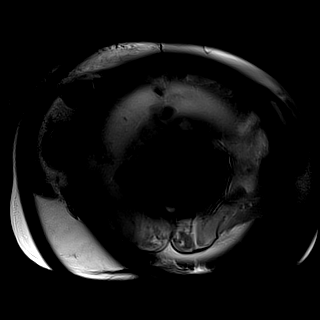
[im 2/48]
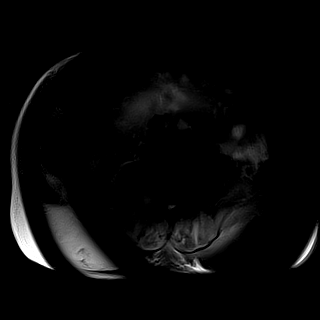
[im 4/48]
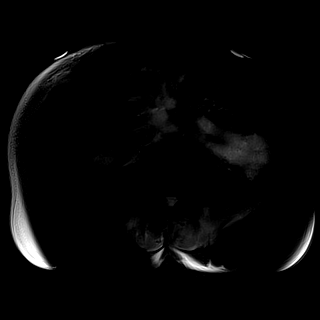
[im 6/48]
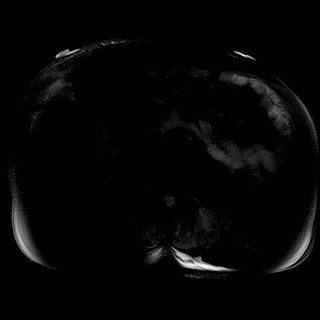
[im 8/48]
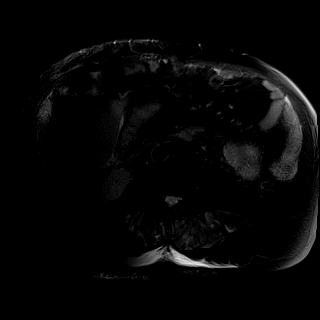
[im 10/48]
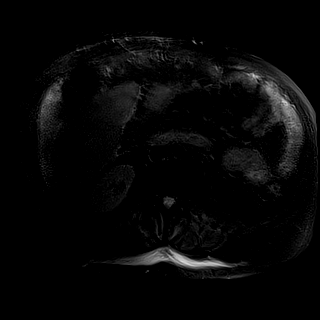
[im 12/48]
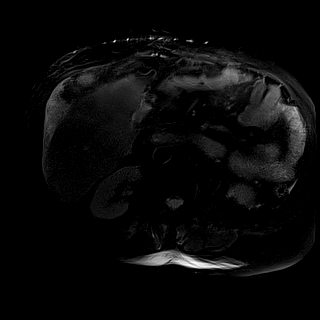
[im 14/48]
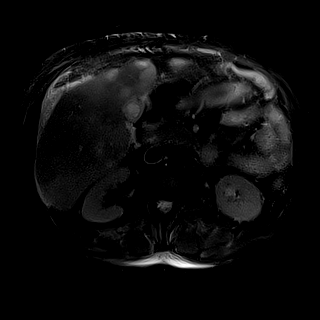
[im 16/48]
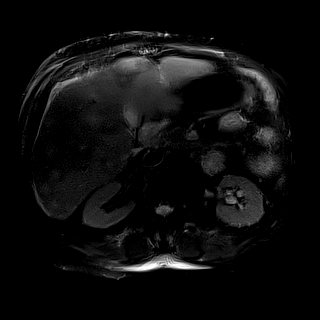
[im 17/48]
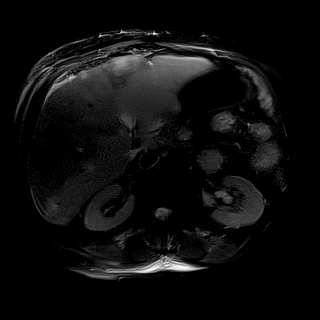
[im 19/48]
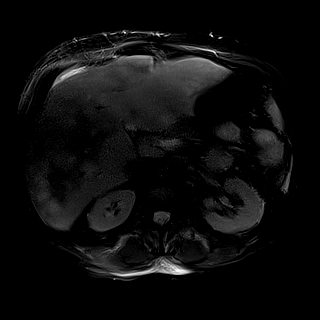
[im 21/48]
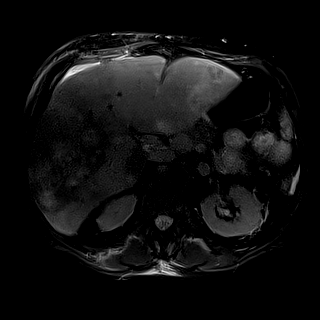
[im 23/48]
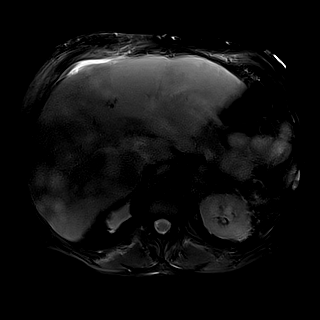
[im 25/48]
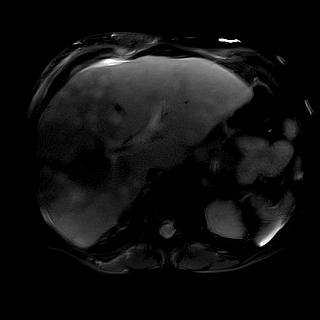
[im 27/48]
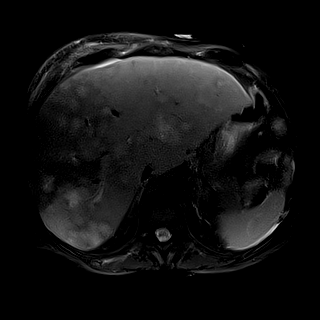
[im 29/48]
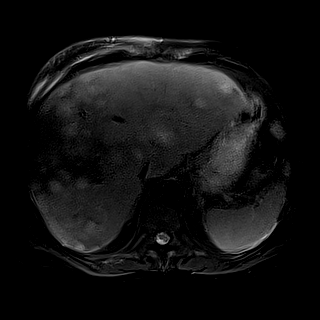
[im 31/48]
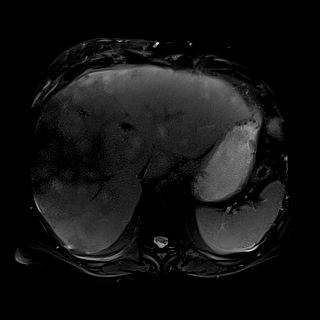
[im 32/48]
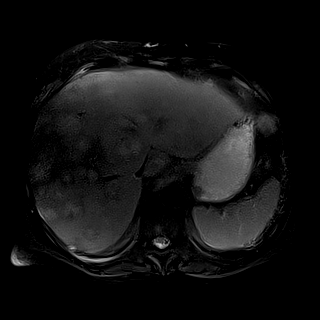
[im 34/48]
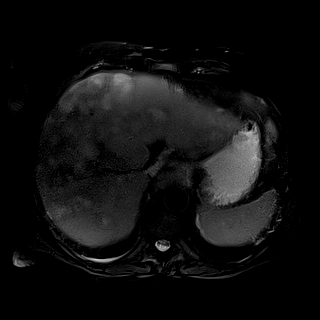
[im 36/48]
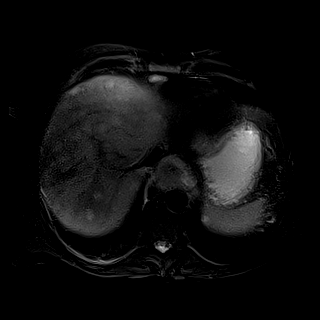
[im 38/48]
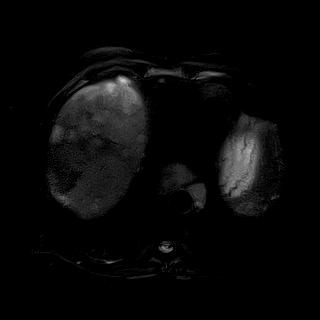
[im 40/48]
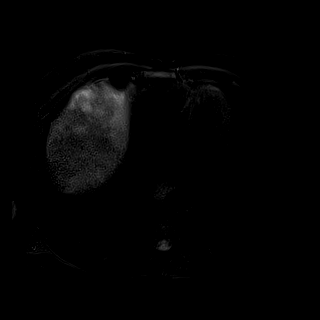
[im 42/48]
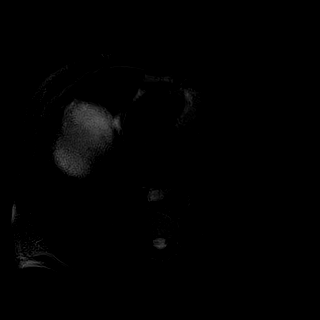
[im 44/48]
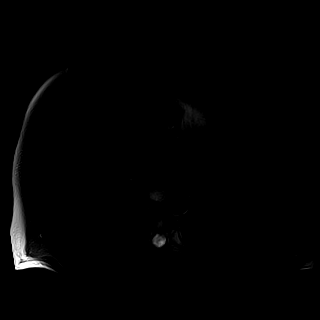
[im 46/48]
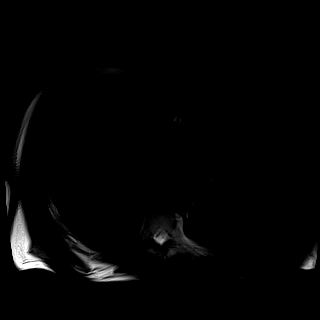
[im 48/48]
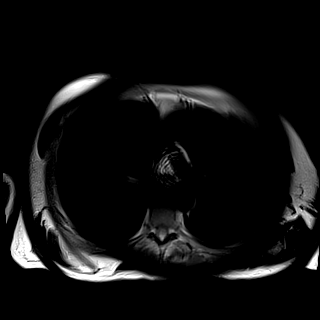

[Series 5: T2 · coronal · 6.0mm · 1.46mm/px · 22 of 41 slices shown]
[im 1/41]
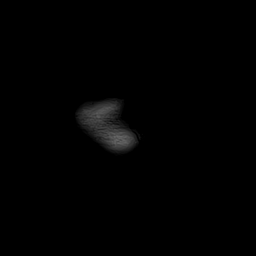
[im 2/41]
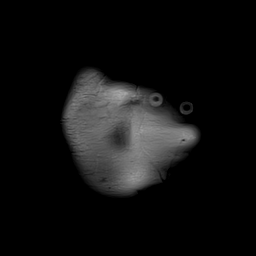
[im 4/41]
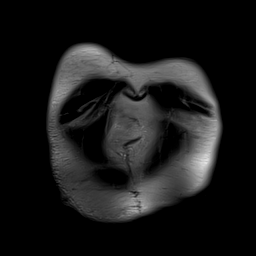
[im 6/41]
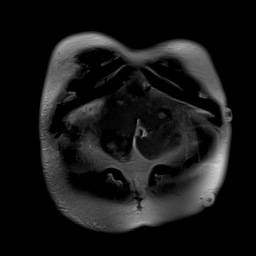
[im 8/41]
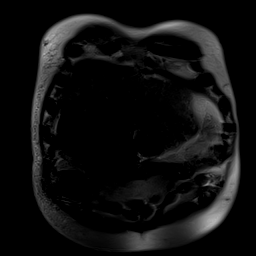
[im 10/41]
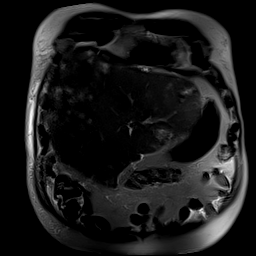
[im 12/41]
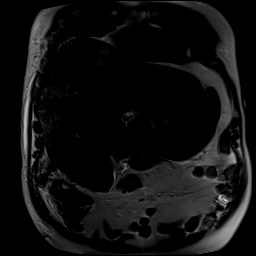
[im 14/41]
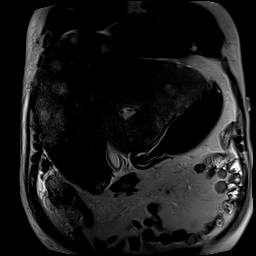
[im 16/41]
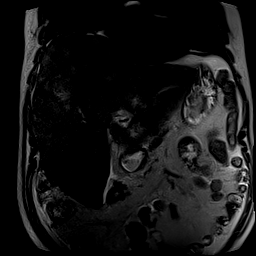
[im 18/41]
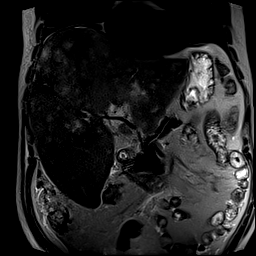
[im 20/41]
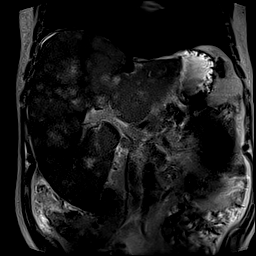
[im 21/41]
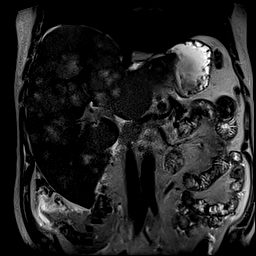
[im 23/41]
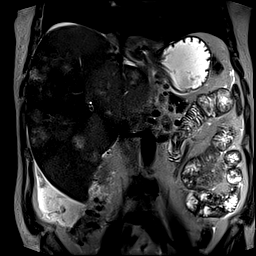
[im 25/41]
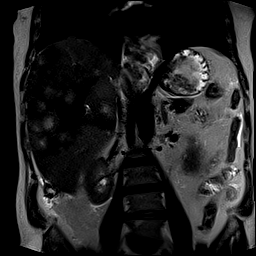
[im 27/41]
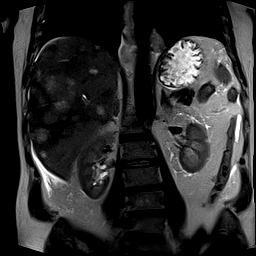
[im 29/41]
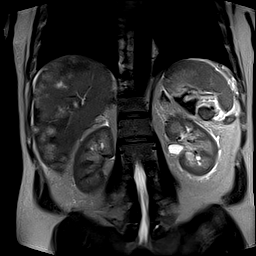
[im 31/41]
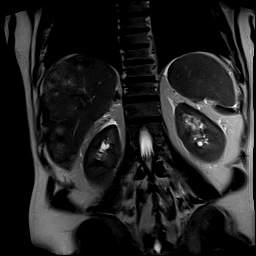
[im 33/41]
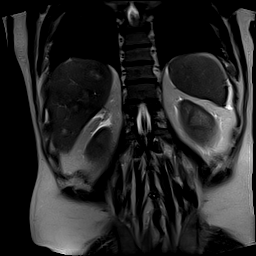
[im 35/41]
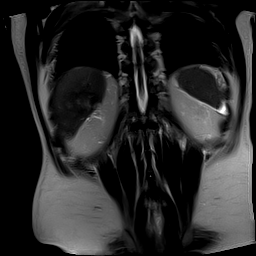
[im 37/41]
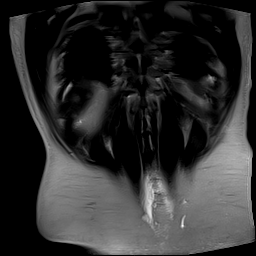
[im 39/41]
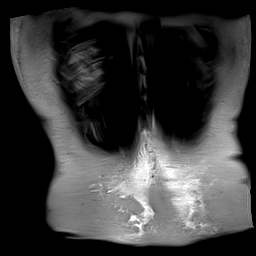
[im 41/41]
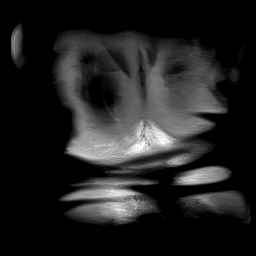

[48 of 48 positions shown; findings below may reference images not displayed]

FINDINGS: Very limited motion degraded noncontrast MRI study lacking multiple
basic sequences as above.

Lower chest: No acute abnormality at the lung bases.

Hepatobiliary: Hepatomegaly. Innumerable (> than 20) mildly T2
hyperintense liver masses scattered throughout the liver, for
example measuring 3.4 x 2.4 cm in the segment 4A left liver lobe
(series 3/image 14), 4.2 x 2.4 cm in the segment 4B left liver lobe
(series 3/image 32) and 2.4 x 2.3 cm in the central segment 5 right
liver (series 3/image 27). In addition, there is abnormal T2
parenchymal signal intensity replacing most of the liver with
geographic areas of sparing in the right liver, suspicious for
widely infiltrative neoplasm. Nondistended gallbladder. Mild diffuse
gallbladder wall thickening. No cholelithiasis. No appreciable
intrahepatic biliary ductal dilatation. Common bile duct diameter 2
mm. No evidence of choledocholithiasis or biliary strictures.

Pancreas: No pancreatic mass or duct dilation.  No pancreas divisum.

Spleen: Normal size. No mass.

Adrenals/Urinary Tract: Normal adrenals. No hydronephrosis. Normal
kidneys with no renal mass.

Stomach/Bowel: Small to moderate hiatal hernia. Otherwise normal
nondistended stomach. Visualized small and large bowel is normal
caliber, with no bowel wall thickening. Colonic diverticulosis.

Vascular/Lymphatic: Normal caliber abdominal aorta. Mildly enlarged
1.0 cm right pericardiophrenic lymph node (series 3/image 13).
Enlarged 1.8 cm porta hepatis node (series 3/image 28). Mildly
enlarged 1.4 cm portacaval node (series 3/image 29). Enlarged 1.3 cm
high left para-aortic node (series 3/image 28).

Other: Trace perihepatic ascites.  No focal fluid collection.

Musculoskeletal: No aggressive appearing focal osseous lesions.
IMPRESSION: 1. Very limited motion degraded noncontrast MRI study, see comments.
2. Innumerable (> than 20) mildly T2 hyperintense liver masses
scattered throughout the liver, compatible with widespread liver
metastases.
3. Abnormal liver parenchymal T2 signal intensity replacing most of
the liver with geographic areas of sparing in the right liver,
suspicious for widely infiltrative liver malignancy. Differential
includes hepatocellular carcinoma or cholangiocarcinoma, favoring
hepatocellular carcinoma.
4. Mild porta hepatis, portacaval and high left para-aortic
lymphadenopathy, concerning for metastatic disease.
5. Trace perihepatic ascites.
6. Small to moderate hiatal hernia.
7. Colonic diverticulosis.
